# Patient Record
Sex: Male | Born: 1970
Health system: Southern US, Community
[De-identification: ages and names within clinical notes are randomized; demographics above are authoritative.]

## PROBLEM LIST (undated history)

## (undated) DIAGNOSIS — M1990 Unspecified osteoarthritis, unspecified site: Secondary | ICD-10-CM

## (undated) DIAGNOSIS — G4733 Obstructive sleep apnea (adult) (pediatric): Secondary | ICD-10-CM

## (undated) DIAGNOSIS — G4730 Sleep apnea, unspecified: Secondary | ICD-10-CM

## (undated) DIAGNOSIS — IMO0002 Reserved for concepts with insufficient information to code with codable children: Secondary | ICD-10-CM

## (undated) DIAGNOSIS — M5030 Other cervical disc degeneration, unspecified cervical region: Principal | ICD-10-CM

## (undated) DIAGNOSIS — I8290 Acute embolism and thrombosis of unspecified vein: Secondary | ICD-10-CM

## (undated) HISTORY — DX: Obstructive sleep apnea (adult) (pediatric): G47.33

## (undated) HISTORY — PX: HERNIA REPAIR: SHX51

## (undated) HISTORY — DX: Unspecified osteoarthritis, unspecified site: M19.90

## (undated) HISTORY — DX: Acute embolism and thrombosis of unspecified vein: I82.90

## (undated) HISTORY — PX: HEMORRHOID SURGERY: SHX153

## (undated) HISTORY — PX: COLONOSCOPY: SHX174

## (undated) HISTORY — DX: Other cervical disc degeneration, unspecified cervical region: M50.30

## (undated) HISTORY — DX: Sleep apnea, unspecified: G47.30

## (undated) HISTORY — DX: Reserved for concepts with insufficient information to code with codable children: IMO0002

---

## 2004-09-03 ENCOUNTER — Emergency Department: Payer: Self-pay | Admitting: Emergency Medicine

## 2009-11-14 ENCOUNTER — Ambulatory Visit: Payer: Self-pay | Admitting: Emergency Medicine

## 2009-11-15 ENCOUNTER — Emergency Department: Payer: Self-pay | Admitting: Internal Medicine

## 2009-11-18 ENCOUNTER — Ambulatory Visit: Payer: Self-pay | Admitting: Emergency Medicine

## 2010-06-21 ENCOUNTER — Emergency Department: Payer: Self-pay | Admitting: Unknown Physician Specialty

## 2013-11-30 ENCOUNTER — Emergency Department: Payer: Self-pay | Admitting: Emergency Medicine

## 2014-04-14 DIAGNOSIS — Z8719 Personal history of other diseases of the digestive system: Secondary | ICD-10-CM | POA: Insufficient documentation

## 2014-05-03 ENCOUNTER — Ambulatory Visit: Payer: Self-pay | Admitting: Gastroenterology

## 2014-05-03 LAB — HM COLONOSCOPY

## 2014-06-07 LAB — SURGICAL PATHOLOGY

## 2015-11-02 ENCOUNTER — Encounter: Payer: Self-pay | Admitting: *Deleted

## 2015-11-02 ENCOUNTER — Emergency Department
Admission: EM | Admit: 2015-11-02 | Discharge: 2015-11-02 | Disposition: A | Discharge status: Home / Self Care | Payer: Federal, State, Local not specified - PPO | Attending: Emergency Medicine | Admitting: Emergency Medicine

## 2015-11-02 DIAGNOSIS — Y929 Unspecified place or not applicable: Secondary | ICD-10-CM | POA: Diagnosis not present

## 2015-11-02 DIAGNOSIS — Y939 Activity, unspecified: Secondary | ICD-10-CM | POA: Insufficient documentation

## 2015-11-02 DIAGNOSIS — S61210A Laceration without foreign body of right index finger without damage to nail, initial encounter: Secondary | ICD-10-CM | POA: Diagnosis present

## 2015-11-02 DIAGNOSIS — W268XXA Contact with other sharp object(s), not elsewhere classified, initial encounter: Secondary | ICD-10-CM | POA: Diagnosis not present

## 2015-11-02 DIAGNOSIS — Y999 Unspecified external cause status: Secondary | ICD-10-CM | POA: Insufficient documentation

## 2015-11-02 DIAGNOSIS — S61219A Laceration without foreign body of unspecified finger without damage to nail, initial encounter: Secondary | ICD-10-CM

## 2015-11-02 MED ORDER — TRAMADOL HCL 50 MG PO TABS: 50.0000 mg | ORAL_TABLET | Freq: Four times a day (QID) | ORAL | 0 refills | Status: DC | PRN

## 2015-11-02 MED ORDER — LIDOCAINE HCL (PF) 1 % IJ SOLN
INTRAMUSCULAR | Status: AC
  Filled 2015-11-02: qty 5

## 2015-11-02 NOTE — ED Provider Notes (Signed)
[facility] Emergency Department Provider Note   ____________________________________________   None    (approximate)  I have reviewed the triage vital signs and the nursing notes.   HISTORY  Chief Complaint Extremity Laceration    HPI Nicholas Patterson is a 45 y.o. male patient with a cut to the right index finger. Patient state finger was cut on a license plate approximately 4 arrival. Patient state he was controlled with direct pressure. Patient states tetanus shot is up-to-date. Patient denies any loss sensation or loss of function of the finger. Patient rates his pain as a 5/10. Patient described a pain as "dull".   History reviewed. No pertinent past medical history.  There are no active problems to display for this patient.   Past Surgical History:  Procedure Laterality Date  . COLONOSCOPY    . HEMORRHOID SURGERY    . HERNIA REPAIR      Prior to Admission medications   Medication Sig Start Date End Date Taking? Authorizing Provider  traMADol (ULTRAM) 50 MG tablet Take 1 tablet (50 mg total) by mouth every 6 (six) hours as needed for moderate pain. 11/02/15   Joni Reining, PA-C    Allergies Oxycodone  History reviewed. No pertinent family history.  Social History Social History  Substance Use Topics  . Smoking status: Never Smoker  . Smokeless tobacco: Never Used  . Alcohol use Yes     Comment: occasionally    Review of Systems Constitutional: No fever/chills Eyes: No visual changes. ENT: No sore throat. Cardiovascular: Denies chest pain. Respiratory: Denies shortness of breath. Gastrointestinal: No abdominal pain.  No nausea, no vomiting.  No diarrhea.  No constipation. Genitourinary: Negative for dysuria. Musculoskeletal: Negative for back pain. Skin: Negative for rash finger. Laceration  Neurological: Negative for headaches, focal weakness or  numbness.    ____________________________________________   PHYSICAL EXAM:  VITAL SIGNS: ED Triage Vitals  Enc Vitals Group     BP 11/02/15 2138 (!) 162/92     Pulse Rate 11/02/15 2138 96     Resp 11/02/15 2138 20     Temp 11/02/15 2138 98.3 F (36.8 C)     Temp Source 11/02/15 2138 Oral     SpO2 11/02/15 2138 98 %     Weight 11/02/15 2139 245 lb (111.1 kg)     Height 11/02/15 2139 6\' 1"  (1.854 m)     Head Circumference --      Peak Flow --      Pain Score 11/02/15 2139 5     Pain Loc --      Pain Edu? --      Excl. in GC? --     Constitutional: Alert and oriented. Well appearing and in no acute distress. Eyes: Conjunctivae are normal. PERRL. EOMI. Head: Atraumatic. Nose: No congestion/rhinnorhea. Mouth/Throat: Mucous membranes are moist.  Oropharynx non-erythematous. Neck: No stridor.  No cervical spine tenderness to palpation. Hematological/Lymphatic/Immunilogical: No cervical lymphadenopathy. Cardiovascular: Normal rate, regular rhythm. Grossly normal heart sounds.  Good peripheral circulation. Respiratory: Normal respiratory effort.  No retractions. Lungs CTAB. Gastrointestinal: Soft and nontender. No distention. No abdominal bruits. No CVA tenderness. Musculoskeletal: No lower extremity tenderness nor edema.  No joint effusions. Neurologic:  Normal speech and language. No gross focal neurologic deficits are appreciated. No gait instability. Skin:  Skin is warm, dry and intact. No rash noted.1.5 cm laceration to the volar aspect of the first digit right hand. Psychiatric: Mood and affect are normal. Speech and behavior  are normal.  ____________________________________________   LABS (all labs ordered are listed, but only abnormal results are displayed)  Labs Reviewed - No data to  display ____________________________________________  EKG   ____________________________________________  RADIOLOGY   ____________________________________________   PROCEDURES  Procedure(s) performed: LACERATION REPAIR Performed by: Joni Reiningonald K Namita Yearwood Authorized by: Joni Reiningonald K Dillon Livermore Consent: Verbal consent obtained. Risks and benefits: risks, benefits and alternatives were discussed Consent given by: patient Patient identity confirmed: provided demographic data Prepped and Draped in normal sterile fashion Wound explored  Laceration Location: Second digit right hand  Laceration Length: 1.5cm  No Foreign Bodies seen or palpated  Anesthesia: Digital block Local anesthetic: lidocaine percent without epinephrine  Anesthetic total: 4 ml  Irrigation method: syringe Amount of cleaning: standard  Skin closure: 4-0 nylon Number of sutures: 6 Technique: Interrupted   Patient tolerance: Patient tolerated the procedure well with no immediate complications.  Procedures  Critical Care performed: No  ____________________________________________   INITIAL IMPRESSION / ASSESSMENT AND PLAN / ED COURSE  Pertinent labs & imaging results that were available during my care of the patient were reviewed by me and considered in my medical decision making (see chart for details).  Finger laceration. Patient given discharge Instructions. Patient advised return back in 10 days for suture removal.  Clinical Course  Patient remained neurovascularly intact status post suturing. Patient fingers will be buddy taped.   ____________________________________________   FINAL CLINICAL IMPRESSION(S) / ED DIAGNOSES  Final diagnoses:  Laceration of finger, initial encounter      NEW MEDICATIONS STARTED DURING THIS VISIT:  New Prescriptions   TRAMADOL (ULTRAM) 50 MG TABLET    Take 1 tablet (50 mg total) by mouth every 6 (six) hours as needed for moderate pain.     Note:  This document  was prepared using Dragon voice recognition software and may include unintentional dictation errors.    Joni ReiningRonald K Maceo Hernan, PA-C 11/02/15 2231    Nita Sicklearolina Veronese, MD 11/02/15 [phone]

## 2015-11-02 NOTE — ED Triage Notes (Signed)
Pt cut R index finger on a license plate x 1 hr ago. Bleeding controlled in triage, dressing placed in triage.

## 2015-12-22 ENCOUNTER — Ambulatory Visit (INDEPENDENT_AMBULATORY_CARE_PROVIDER_SITE_OTHER): Payer: Federal, State, Local not specified - PPO | Admitting: Urology

## 2015-12-22 ENCOUNTER — Encounter: Payer: Self-pay | Admitting: Urology

## 2015-12-22 VITALS — BP 138/82 | HR 97 | Ht 73.0 in | Wt 262.4 lb

## 2015-12-22 DIAGNOSIS — Z3009 Encounter for other general counseling and advice on contraception: Secondary | ICD-10-CM | POA: Diagnosis not present

## 2015-12-22 MED ORDER — DIAZEPAM 10 MG PO TABS: ORAL_TABLET | ORAL | 0 refills | Status: DC

## 2015-12-22 NOTE — Progress Notes (Signed)
12/22/2015 12:06 PM   Nicholas Maroonarl Richard Entwistle 08/01/1970 [id number]  Referring provider: [facility] [address] [place], [place] [address]  Chief Complaint  Patient presents with  . VAS Consult    new patient referred by [facility] Dr. Richarda BladeAdamo    HPI: Mr. Nicholas Patterson is a 45 year old African American male presents today requesting a vasectomy.  Patient has 3 children, one son and two daughters, and wishes to end his family unit at this point.  Patient denies any history of chronic prostatitis, epididymitis, orchitis, or other genital pain.  Today, we discussed what the vas deferens is, where it is located, and its function. We reviewed the procedure for vasectomy, it's risks, benefits, alternatives, and likelihood of achieving his goals.   We discussed in detail the procedure, complications, and recovery as well as the need for clearance prior to unprotected intercourse. We discussed that vasectomy does not protect against sexually transmitted diseases. We discussed that this procedure does not result in immediate sterility and that they would need to use other forms of birth control until he has been cleared with a three month negative postvasectomy semen analyses.  I explained that the procedure is considered to be permanent and that attempts at reversal have varying degrees of success. These options include vasectomy reversal, sperm retrieval, and in vitro fertilization; these can be very expensive.   We discussed the chance of postvasectomy pain syndrome which occurs in less than 5% of patients. I explained to the patient that there is no treatment to resolve this chronic pain, and that if it developed I would not be able to help resolve the issue, but that surgery is generally not needed for correction.   I explained there have even been reports of systemic like illness associated with this chronic pain, and that there was no good cure. I explained that  vasectomy it is not a 100% reliable form of birth control, and the risk of pregnancy after vasectomy is approximately 1 in 2000 men who had a negative postvasectomy semen analysis or rare non-motile sperm.  I explained that repeat vasectomy was necessary in less than 1% of vasectomy procedures when employing the type of technique that is performed in the office. I explained that he should refrain from ejaculation for approximately one week following vasectomy. I explained that there are other options for birth control which are permanent and non-permanent; we discussed these.  I explained the rates of surgical complications, such as symptomatic hematoma or infection, are low (1-2%) and vary with the surgeon's experience and criteria used to diagnose the complication.   PMH: No past medical history on file.  Surgical History: Past Surgical History:  Procedure Laterality Date  . COLONOSCOPY    . HEMORRHOID SURGERY    . HERNIA REPAIR      Home Medications:    Medication List       Accurate as of 12/22/15 12:06 PM. Always use your most recent med list.          diazepam 10 MG tablet Commonly known as:  VALIUM Take one tablet 30 minutes prior to the vasectomy   traMADol 50 MG tablet Commonly known as:  ULTRAM Take 1 tablet (50 mg total) by mouth every 6 (six) hours as needed for moderate pain.       Allergies:  Allergies  Allergen Reactions  . Oxycodone Nausea And Vomiting    Family History: Family History  Problem Relation Age  of Onset  . Prostate cancer Neg Hx   . Kidney disease Neg Hx     Social History:  reports that he has never smoked. He has never used smokeless tobacco. He reports that he drinks alcohol. He reports that he does not use drugs.  ROS: UROLOGY Frequent Urination?: No Hard to postpone urination?: No Burning/pain with urination?: No Get up at night to urinate?: No Leakage of urine?: No Urine stream starts and stops?: No Trouble starting  stream?: No Do you have to strain to urinate?: No Blood in urine?: No Urinary tract infection?: No Sexually transmitted disease?: No Injury to kidneys or bladder?: No Painful intercourse?: No Weak stream?: No Erection problems?: No Penile pain?: No  Gastrointestinal Nausea?: No Vomiting?: No Indigestion/heartburn?: No Diarrhea?: No Constipation?: No  Constitutional Fever: No Night sweats?: No Weight loss?: No Fatigue?: No  Skin Skin rash/lesions?: No Itching?: No  Eyes Blurred vision?: No Double vision?: No  Ears/Nose/Throat Sore throat?: No Sinus problems?: No  Hematologic/Lymphatic Swollen glands?: No Easy bruising?: No  Cardiovascular Leg swelling?: No Chest pain?: No  Respiratory Cough?: No Shortness of breath?: No  Endocrine Excessive thirst?: No  Musculoskeletal Back pain?: No Joint pain?: No  Neurological Headaches?: No Dizziness?: No  Psychologic Depression?: No Anxiety?: No  Physical Exam: BP 138/82   Pulse 97   Ht 6\' 1"  (1.854 m)   Wt 262 lb 6.4 oz (119 kg)   BMI 34.62 kg/m   Constitutional: Well nourished. Alert and oriented, No acute distress. HEENT: [place] AT, moist mucus membranes. Trachea midline, no masses. Cardiovascular: No clubbing, cyanosis, or edema. Respiratory: Normal respiratory effort, no increased work of breathing. GI: Abdomen is soft, non tender, non distended, no abdominal masses. Liver and spleen not palpable.  No hernias appreciated.  Stool sample for occult testing is not indicated.   GU: No CVA tenderness.  No bladder fullness or masses.  Patient with circumcised phallus.  Urethral meatus is patent.  No penile discharge. No penile lesions or rashes. Scrotum without lesions, cysts, rashes and/or edema.  Testicles are located scrotally bilaterally. No masses are appreciated in the testicles. Left and right epididymis are normal. Rectal: Deferred. Skin: No rashes, bruises or suspicious lesions. Lymph: No  cervical or inguinal adenopathy. Neurologic: Grossly intact, no focal deficits, moving all 4 extremities. Psychiatric: Normal mood and affect.   Assessment & Plan:    1. Vasectomy consult:  Patient has read and signed the consent.  He is given the pre-op vasectomy instruction sheet.  He is prescribed Valium 10 mg and instructed to take it 30 minutes prior to his vasectomy appointment.  He is to have a driver.  I reemphasized to the patient that this is to be considered a permanent form of birth control, that he is to use an alternative form of birth control until we receive the 3 months specimen and it is cleared of sperm and that this will not prevent STI's.  His questions are answered to his satisfaction and he understands the risks and is willing to proceed with the vasectomy.  He will schedule his vasectomy.    I spent 30 minutes in a face-to-face conversation concerning the vasectomy procedure and pre-and post op expectations.  Greater than 50% was spent in counseling & coordination of care with the patient.   Return for schedule vasectomy.  These notes generated with voice recognition software. I apologize for typographical errors.  Michiel CowboySHANNON Robinn Overholt, PA-C  [facility] [address], [address] [place],  [place] [address] [phone]

## 2016-09-08 DIAGNOSIS — S50819A Abrasion of unspecified forearm, initial encounter: Secondary | ICD-10-CM | POA: Diagnosis not present

## 2016-09-08 DIAGNOSIS — M25562 Pain in left knee: Secondary | ICD-10-CM | POA: Diagnosis not present

## 2016-09-09 ENCOUNTER — Emergency Department: Payer: Federal, State, Local not specified - PPO

## 2016-09-09 ENCOUNTER — Encounter: Payer: Self-pay | Admitting: Emergency Medicine

## 2016-09-09 ENCOUNTER — Emergency Department
Admission: EM | Admit: 2016-09-09 | Discharge: 2016-09-09 | Disposition: A | Discharge status: Home / Self Care | Payer: Federal, State, Local not specified - PPO | Attending: Emergency Medicine | Admitting: Emergency Medicine

## 2016-09-09 DIAGNOSIS — R102 Pelvic and perineal pain: Secondary | ICD-10-CM | POA: Diagnosis not present

## 2016-09-09 DIAGNOSIS — Y92410 Unspecified street and highway as the place of occurrence of the external cause: Secondary | ICD-10-CM | POA: Insufficient documentation

## 2016-09-09 DIAGNOSIS — S40211A Abrasion of right shoulder, initial encounter: Secondary | ICD-10-CM | POA: Diagnosis not present

## 2016-09-09 DIAGNOSIS — T07XXXA Unspecified multiple injuries, initial encounter: Secondary | ICD-10-CM

## 2016-09-09 DIAGNOSIS — T148XXA Other injury of unspecified body region, initial encounter: Secondary | ICD-10-CM | POA: Diagnosis not present

## 2016-09-09 DIAGNOSIS — M25551 Pain in right hip: Secondary | ICD-10-CM | POA: Insufficient documentation

## 2016-09-09 DIAGNOSIS — Z79899 Other long term (current) drug therapy: Secondary | ICD-10-CM | POA: Insufficient documentation

## 2016-09-09 DIAGNOSIS — M25552 Pain in left hip: Secondary | ICD-10-CM | POA: Insufficient documentation

## 2016-09-09 DIAGNOSIS — S199XXA Unspecified injury of neck, initial encounter: Secondary | ICD-10-CM | POA: Diagnosis not present

## 2016-09-09 DIAGNOSIS — Y999 Unspecified external cause status: Secondary | ICD-10-CM | POA: Diagnosis not present

## 2016-09-09 DIAGNOSIS — S3991XA Unspecified injury of abdomen, initial encounter: Secondary | ICD-10-CM | POA: Diagnosis not present

## 2016-09-09 DIAGNOSIS — Y9389 Activity, other specified: Secondary | ICD-10-CM | POA: Diagnosis not present

## 2016-09-09 DIAGNOSIS — S299XXA Unspecified injury of thorax, initial encounter: Secondary | ICD-10-CM | POA: Diagnosis not present

## 2016-09-09 DIAGNOSIS — S3993XA Unspecified injury of pelvis, initial encounter: Secondary | ICD-10-CM | POA: Diagnosis not present

## 2016-09-09 DIAGNOSIS — S0990XA Unspecified injury of head, initial encounter: Secondary | ICD-10-CM | POA: Diagnosis not present

## 2016-09-09 DIAGNOSIS — S40212A Abrasion of left shoulder, initial encounter: Secondary | ICD-10-CM | POA: Diagnosis not present

## 2016-09-09 LAB — BASIC METABOLIC PANEL
ANION GAP: 8 (ref 5–15)
BUN: 24 mg/dL — ABNORMAL HIGH (ref 6–20)
CHLORIDE: 106 mmol/L (ref 101–111)
CO2: 26 mmol/L (ref 22–32)
Calcium: 8.7 mg/dL — ABNORMAL LOW (ref 8.9–10.3)
Creatinine, Ser: 1.31 mg/dL — ABNORMAL HIGH (ref 0.61–1.24)
GFR calc Af Amer: >60 mL/min (ref >60)
GLUCOSE: 115 mg/dL — AB (ref 65–99)
POTASSIUM: 3.4 mmol/L — AB (ref 3.5–5.1)
Sodium: 140 mmol/L (ref 135–145)

## 2016-09-09 LAB — CBC
HEMATOCRIT: 44.5 % (ref 40.0–52.0)
HEMOGLOBIN: 14.9 g/dL (ref 13.0–18.0)
MCH: 27.7 pg (ref 26.0–34.0)
MCHC: 33.5 g/dL (ref 32.0–36.0)
MCV: 82.5 fL (ref 80.0–100.0)
PLATELETS: 218 10*3/uL (ref 150–440)
RBC: 5.4 MIL/uL (ref 4.40–5.90)
RDW: 14.6 % — ABNORMAL HIGH (ref 11.5–14.5)
WBC: 10.8 10*3/uL — AB (ref 3.8–10.6)

## 2016-09-09 MED ORDER — BACITRACIN ZINC 500 UNIT/GM EX OINT
TOPICAL_OINTMENT | Freq: Once | CUTANEOUS | Status: AC
  Administered 2016-09-09: 02:00:00 via TOPICAL
  Filled 2016-09-09: qty 5.4

## 2016-09-09 MED ORDER — HYDROCODONE-ACETAMINOPHEN 7.5-325 MG/15ML PO SOLN: 15.0000 mL | Freq: Four times a day (QID) | ORAL | 0 refills | Status: AC | PRN

## 2016-09-09 MED ORDER — KETOROLAC TROMETHAMINE 30 MG/ML IJ SOLN
30.0000 mg | Freq: Once | INTRAMUSCULAR | Status: AC
  Administered 2016-09-09: 30 mg via INTRAVENOUS
  Filled 2016-09-09: qty 1

## 2016-09-09 MED ORDER — HYDROCODONE-ACETAMINOPHEN 5-325 MG PO TABS
2.0000 | ORAL_TABLET | Freq: Once | ORAL | Status: AC
  Administered 2016-09-09: 2 via ORAL
  Filled 2016-09-09: qty 2

## 2016-09-09 MED ORDER — IOPAMIDOL (ISOVUE-300) INJECTION 61%
100.0000 mL | Freq: Once | INTRAVENOUS | Status: AC | PRN
  Administered 2016-09-09: 100 mL via INTRAVENOUS

## 2016-09-09 MED ORDER — IBUPROFEN 800 MG PO TABS: 800.0000 mg | ORAL_TABLET | Freq: Three times a day (TID) | ORAL | 0 refills | Status: DC | PRN
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
## 2016-09-12 DIAGNOSIS — K080 Exfoliation of teeth due to systemic causes: Secondary | ICD-10-CM | POA: Diagnosis not present

## 2016-09-12 DIAGNOSIS — K056 Periodontal disease, unspecified: Secondary | ICD-10-CM | POA: Diagnosis not present

## 2016-09-12 DIAGNOSIS — S40819D Abrasion of unspecified upper arm, subsequent encounter: Secondary | ICD-10-CM | POA: Diagnosis not present

## 2016-09-12 DIAGNOSIS — S40219D Abrasion of unspecified shoulder, subsequent encounter: Secondary | ICD-10-CM | POA: Diagnosis not present

## 2016-09-12 DIAGNOSIS — S80819D Abrasion, unspecified lower leg, subsequent encounter: Secondary | ICD-10-CM | POA: Diagnosis not present

## 2016-09-13 DIAGNOSIS — I493 Ventricular premature depolarization: Secondary | ICD-10-CM | POA: Diagnosis not present

## 2016-09-13 DIAGNOSIS — R071 Chest pain on breathing: Secondary | ICD-10-CM | POA: Diagnosis not present

## 2016-09-13 DIAGNOSIS — Z885 Allergy status to narcotic agent status: Secondary | ICD-10-CM | POA: Diagnosis not present

## 2016-09-13 DIAGNOSIS — R079 Chest pain, unspecified: Secondary | ICD-10-CM | POA: Diagnosis not present

## 2016-09-13 DIAGNOSIS — R0781 Pleurodynia: Secondary | ICD-10-CM | POA: Diagnosis not present

## 2016-09-14 DIAGNOSIS — K080 Exfoliation of teeth due to systemic causes: Secondary | ICD-10-CM | POA: Diagnosis not present

## 2016-09-19 DIAGNOSIS — S80819D Abrasion, unspecified lower leg, subsequent encounter: Secondary | ICD-10-CM | POA: Diagnosis not present

## 2016-09-19 DIAGNOSIS — S40219D Abrasion of unspecified shoulder, subsequent encounter: Secondary | ICD-10-CM | POA: Diagnosis not present

## 2016-09-19 DIAGNOSIS — S40819D Abrasion of unspecified upper arm, subsequent encounter: Secondary | ICD-10-CM | POA: Diagnosis not present

## 2016-10-09 DIAGNOSIS — R220 Localized swelling, mass and lump, head: Secondary | ICD-10-CM | POA: Diagnosis not present

## 2016-10-09 DIAGNOSIS — K0510 Chronic gingivitis, plaque induced: Secondary | ICD-10-CM | POA: Diagnosis not present

## 2016-10-09 DIAGNOSIS — M278 Other specified diseases of jaws: Secondary | ICD-10-CM | POA: Diagnosis not present

## 2016-10-09 DIAGNOSIS — J342 Deviated nasal septum: Secondary | ICD-10-CM | POA: Diagnosis not present

## 2016-10-17 DIAGNOSIS — K080 Exfoliation of teeth due to systemic causes: Secondary | ICD-10-CM | POA: Diagnosis not present

## 2016-11-06 DIAGNOSIS — K080 Exfoliation of teeth due to systemic causes: Secondary | ICD-10-CM | POA: Diagnosis not present

## 2016-11-14 DIAGNOSIS — K080 Exfoliation of teeth due to systemic causes: Secondary | ICD-10-CM | POA: Diagnosis not present

## 2017-03-07 DIAGNOSIS — R062 Wheezing: Secondary | ICD-10-CM | POA: Diagnosis not present

## 2017-03-07 DIAGNOSIS — Z1389 Encounter for screening for other disorder: Secondary | ICD-10-CM | POA: Diagnosis not present

## 2017-03-07 DIAGNOSIS — R030 Elevated blood-pressure reading, without diagnosis of hypertension: Secondary | ICD-10-CM | POA: Diagnosis not present

## 2017-03-07 DIAGNOSIS — J069 Acute upper respiratory infection, unspecified: Secondary | ICD-10-CM | POA: Diagnosis not present

## 2017-07-15 ENCOUNTER — Ambulatory Visit: Payer: Federal, State, Local not specified - PPO | Admitting: Family Medicine

## 2017-07-23 ENCOUNTER — Ambulatory Visit: Payer: Federal, State, Local not specified - PPO | Admitting: Family Medicine

## 2017-10-17 ENCOUNTER — Ambulatory Visit: Payer: Federal, State, Local not specified - PPO | Admitting: Family Medicine

## 2018-01-20 ENCOUNTER — Ambulatory Visit (INDEPENDENT_AMBULATORY_CARE_PROVIDER_SITE_OTHER): Payer: Federal, State, Local not specified - PPO | Admitting: Family Medicine

## 2018-01-20 ENCOUNTER — Encounter: Payer: Self-pay | Admitting: Family Medicine

## 2018-01-20 VITALS — BP 136/72 | HR 117 | Temp 98.2°F | Resp 16 | Ht 71.75 in | Wt 272.4 lb

## 2018-01-20 DIAGNOSIS — E669 Obesity, unspecified: Secondary | ICD-10-CM

## 2018-01-20 DIAGNOSIS — Z23 Encounter for immunization: Secondary | ICD-10-CM

## 2018-01-20 DIAGNOSIS — R519 Headache, unspecified: Secondary | ICD-10-CM

## 2018-01-20 DIAGNOSIS — Z131 Encounter for screening for diabetes mellitus: Secondary | ICD-10-CM | POA: Diagnosis not present

## 2018-01-20 DIAGNOSIS — R51 Headache: Secondary | ICD-10-CM

## 2018-01-20 DIAGNOSIS — M5030 Other cervical disc degeneration, unspecified cervical region: Secondary | ICD-10-CM

## 2018-01-20 DIAGNOSIS — R030 Elevated blood-pressure reading, without diagnosis of hypertension: Secondary | ICD-10-CM

## 2018-01-20 DIAGNOSIS — Z13220 Encounter for screening for lipoid disorders: Secondary | ICD-10-CM | POA: Diagnosis not present

## 2018-01-20 DIAGNOSIS — Z114 Encounter for screening for human immunodeficiency virus [HIV]: Secondary | ICD-10-CM

## 2018-01-20 DIAGNOSIS — Z130 Encounter for screening for diseases of the blood and blood-forming organs and certain disorders involving the immune mechanism: Secondary | ICD-10-CM

## 2018-01-20 HISTORY — DX: Other cervical disc degeneration, unspecified cervical region: M50.30

## 2018-01-20 NOTE — Patient Instructions (Signed)
Caffeine Withdrawal Caffeine withdrawal is a group of symptoms that can develop when a person who consumes a lot of caffeine every day suddenly stops or greatly reduces his or her caffeine intake. Caffeine is a drug that is usually found in coffee, tea, soda, cocoa, chocolate milk, chocolate, and some over-the-counter pain relievers and medicines. If you consume too much caffeine for a long period of time, you may go through caffeine withdrawal when you stop or reduce your intake. What are the causes? This condition is caused by having less caffeine than your body is used to having. What increases the risk? The following factors may make you more likely to develop this condition:  Having a history of other substance use disorders.  Having a history of a mood disorder, anxiety disorder, psychiatric disorder, or eating disorder.  Being in a situation that restricts caffeine use, such as pregnancy, fasting, medical procedures, or hospitalization.  Using energy drinks.  What are the signs or symptoms? Symptoms of this condition include:  Feeling more tired than usual.  Headaches.  Having trouble concentrating or staying alert.  Feeling irritable.  Feeling like you have the flu.  Craving caffeine.  Depression.  Nausea or vomiting.  Joint stiffness or aching muscles.  Your symptoms may be more or less severe, depending on how much caffeine you consume or have been consuming over time. How is this diagnosed? This condition is usually diagnosed based on your symptoms and your recent history of caffeine use. Your health care provider may ask you about any history of stimulant abuse or use of other substances. In some cases, you may be asked to use a food journal to keep track of how much caffeine you have every day. How is this treated? Treatment for this condition will focus on addressing the symptoms. Your health care provider may recommend that you:  Consume more caffeine at first  to help end the withdrawal symptoms.  Slowly reduce your caffeine use over time to avoid symptoms of caffeine withdrawal while removing it from your diet. Your health care provider can help you decide if you want to limit (cut back on) or eliminate caffeine from your diet.  Other treatments may be recommended to help with any underlying reasons for your high caffeine use. Your health care provider may also recommend techniques to manage stress. Follow these instructions at home:  Do not stop having caffeine all at once. Doing that may cause severe withdrawal symptoms.  To avoid withdrawal symptoms, do not have more than 50 mg of caffeine-equal to  cup of coffee-in one day.  Cut back on caffeine slowly over time as directed by your health care provider. For example, try mixing a caffeinated soda with a decaf (decaffeinated) soda.  Try replacing coffee, tea, or soda with a decaf drink.  Find ways to manage stress, such as by: ? Meditating. ? Being more active. ? Using deep breathing exercises. Contact a health care provider if:  Your headaches or other withdrawal symptoms do not go away after several days of reduced usage, or they do not go away after you start using caffeine again. Get help right away if:  You feel depressed or have suicidal thoughts.  You are vomiting or have severe dehydration. If you ever feel like you may hurt yourself or others, or have thoughts about taking your own life, get help right away. You can go to your nearest emergency department or call:  Your local emergency services (911 in the [place]  A suicide crisis helpline, such as the National Suicide Prevention Lifeline at 4843377284. This is open 24 hours a day.  Summary  Caffeine withdrawal is a group of symptoms that can develop when a person who consumes a lot of caffeine every day suddenly stops or greatly reduces his or her caffeine intake.  Your withdrawal symptoms may be more or less severe,  depending on how much caffeine you consume or have been consuming over time.  Your health care provider can help you decide if you want to limit (cut back on) or eliminate caffeine from your diet.  To avoid withdrawal symptoms, do not have more than 50 mg of caffeine-equal to  cup of coffee-in one day. This information is not intended to replace advice given to you by your health care provider. Make sure you discuss any questions you have with your health care provider. Document Released: 05/24/2016 Document Revised: 05/24/2016 Document Reviewed: 05/24/2016 Elsevier Interactive Patient Education  2018 [employer]. Analgesic Rebound Headache An analgesic rebound headache, sometimes called a medication overuse headache, is a headache that comes after pain medicine (analgesic) taken to treat the original (primary) headache has worn off. Any type of primary headache can return as a rebound headache if a person regularly takes analgesics more than three times a week to treat it. The types of primary headaches that are commonly associated with rebound headaches include:  Migraines.  Headaches that arise from tense muscles in the head and neck area (tension headaches).  Headaches that develop and happen again (recur) on one side of the head and around the eye (cluster headaches).  If rebound headaches continue, they become chronic daily headaches. What are the causes? This condition may be caused by frequent use of:  Over-the-counter medicines such as aspirin, ibuprofen, and acetaminophen.  Sinus relief medicines and other medicines that contain caffeine.  Narcotic pain medicines such as codeine and oxycodone.  What are the signs or symptoms? The symptoms of a rebound headache are the same as the symptoms of the original headache. Some of the symptoms of specific types of headaches include: Migraine headache  Pulsing or throbbing pain on one or both sides of the head.  Severe pain that  interferes with daily activities.  Pain that is worsened by physical activity.  Nausea, vomiting, or both.  Pain with exposure to bright light, loud noises, or strong smells.  General sensitivity to bright light, loud noises, or strong smells.  Visual changes.  Numbness of one or both arms. Tension headache  Pressure around the head.  Dull, aching head pain.  Pain felt over the front and sides of the head.  Tenderness in the muscles of the head, neck, and shoulders. Cluster headache  Severe pain that begins in or around one eye or temple.  Redness and tearing in the eye on the same side as the pain.  Droopy or swollen eyelid.  One-sided head pain.  Nausea.  Runny nose.  Sweaty, pale facial skin.  Restlessness. How is this diagnosed? This condition is diagnosed by:  Reviewing your medical history. This includes the nature of your primary headaches.  Reviewing the types of pain medicines that you have been using to treat your headaches and how often you take them.  How is this treated? This condition may be treated or managed by:  Discontinuing frequent use of the analgesic medicine. Doing this may worsen your headaches at first, but the pain should eventually become more manageable, less frequent, and less severe.  Seeing a headache specialist. He or she may be able to help you manage your headaches and help make sure there is not another cause of the headaches.  Using methods of stress relief, such as acupuncture, counseling, biofeedback, and massage. Talk with your health care provider about which methods might be good for you.  Follow these instructions at home:  Take over-the-counter and prescription medicines only as told by your health care provider.  Stop the repeated use of pain medicine as told by your health care provider. Stopping can be difficult. Carefully follow instructions from your health care provider.  Avoid triggers that are known to  cause your primary headaches.  Keep all follow-up visits as told by your health care provider. This is important. Contact a health care provider if:  You continue to experience headaches after following treatments that your health care provider recommended. Get help right away if:  You develop new headache pain.  You develop headache pain that is different than what you have experienced in the past.  You develop numbness or tingling in your arms or legs.  You develop changes in your speech or vision. This information is not intended to replace advice given to you by your health care provider. Make sure you discuss any questions you have with your health care provider. Document Released: 04/21/2003 Document Revised: 08/19/2015 Document Reviewed: 07/04/2015 Elsevier Interactive Patient Education  [employer].
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
## 2018-01-21 LAB — COMPLETE METABOLIC PANEL WITH GFR
AG RATIO: 1.6 (calc) (ref 1.0–2.5)
ALBUMIN MSPROF: 4.2 g/dL (ref 3.6–5.1)
ALT: 28 U/L (ref 9–46)
AST: 31 U/L (ref 10–40)
Alkaline phosphatase (APISO): 70 U/L (ref 40–115)
BUN: 17 mg/dL (ref 7–25)
CALCIUM: 9.4 mg/dL (ref 8.6–10.3)
CHLORIDE: 104 mmol/L (ref 98–110)
CO2: 27 mmol/L (ref 20–32)
CREATININE: 1.25 mg/dL (ref 0.60–1.35)
GFR, EST AFRICAN AMERICAN: 80 mL/min/{1.73_m2} (ref >=60)
GFR, EST NON AFRICAN AMERICAN: 69 mL/min/{1.73_m2} (ref >=60)
GLOBULIN: 2.6 g/dL (ref 1.9–3.7)
Glucose, Bld: 104 mg/dL (ref 65–139)
POTASSIUM: 3.7 mmol/L (ref 3.5–5.3)
SODIUM: 141 mmol/L (ref 135–146)
TOTAL PROTEIN: 6.8 g/dL (ref 6.1–8.1)
Total Bilirubin: 0.7 mg/dL (ref 0.2–1.2)

## 2018-01-21 LAB — HIV ANTIBODY (ROUTINE TESTING W REFLEX): HIV 1&2 Ab, 4th Generation: NONREACTIVE

## 2018-01-21 LAB — CBC WITH DIFFERENTIAL/PLATELET
BASOS ABS: 53 {cells}/uL (ref 0–200)
BASOS PCT: 0.6 %
Eosinophils Absolute: 141 cells/uL (ref 15–500)
Eosinophils Relative: 1.6 %
HEMATOCRIT: 46.1 % (ref 38.5–50.0)
HEMOGLOBIN: 15.5 g/dL (ref 13.2–17.1)
LYMPHS ABS: 2939 {cells}/uL (ref 850–3900)
MCH: 28 pg (ref 27.0–33.0)
MCHC: 33.6 g/dL (ref 32.0–36.0)
MCV: 83.4 fL (ref 80.0–100.0)
MONOS PCT: 5.6 %
MPV: 10.3 fL (ref 7.5–12.5)
Neutro Abs: 5174 cells/uL (ref 1500–7800)
Neutrophils Relative %: 58.8 %
Platelets: 283 10*3/uL (ref 140–400)
RBC: 5.53 10*6/uL (ref 4.20–5.80)
RDW: 13.2 % (ref 11.0–15.0)
Total Lymphocyte: 33.4 %
WBC: 8.8 10*3/uL (ref 3.8–10.8)
WBCMIX: 493 {cells}/uL (ref 200–950)

## 2018-01-21 LAB — LIPID PANEL
Cholesterol: 207 mg/dL — ABNORMAL HIGH (ref <200)
HDL: 42 mg/dL (ref >40)
LDL Cholesterol (Calc): 122 mg/dL (calc) — ABNORMAL HIGH
NON-HDL CHOLESTEROL (CALC): 165 mg/dL — AB (ref <130)
Total CHOL/HDL Ratio: 4.9 (calc) (ref <5.0)
Triglycerides: 299 mg/dL — ABNORMAL HIGH (ref <150)

## 2018-01-21 LAB — HEMOGLOBIN A1C
HEMOGLOBIN A1C: 5.8 %{Hb} — AB (ref <5.7)
Mean Plasma Glucose: 120 (calc)
eAG (mmol/L): 6.6 (calc)

## 2018-01-21 MED ORDER — AMITRIPTYLINE HCL 25 MG PO TABS: 25.0000 mg | ORAL_TABLET | Freq: Every day | ORAL | 0 refills | Status: DC

## 2018-01-24 ENCOUNTER — Encounter: Payer: Self-pay | Admitting: Family Medicine

## 2018-01-29 ENCOUNTER — Encounter: Payer: Self-pay | Admitting: Family Medicine

## 2018-02-17 ENCOUNTER — Other Ambulatory Visit: Payer: Self-pay | Admitting: Family Medicine

## 2018-02-17 DIAGNOSIS — R519 Headache, unspecified: Secondary | ICD-10-CM

## 2018-02-17 DIAGNOSIS — R51 Headache: Principal | ICD-10-CM

## 2018-02-17 NOTE — Telephone Encounter (Signed)
Refill request for general medication.  Amitriptyline to [facility].   Last office visit 01/20/2018  No follow-ups on file.

## 2018-02-24 ENCOUNTER — Ambulatory Visit: Payer: Federal, State, Local not specified - PPO | Admitting: Family Medicine

## 2018-02-26 ENCOUNTER — Encounter: Payer: Self-pay | Admitting: Family Medicine

## 2018-02-26 ENCOUNTER — Ambulatory Visit (INDEPENDENT_AMBULATORY_CARE_PROVIDER_SITE_OTHER): Payer: Federal, State, Local not specified - PPO | Admitting: Family Medicine

## 2018-02-26 VITALS — BP 112/76 | HR 87 | Temp 98.7°F | Resp 16 | Ht 72.0 in | Wt 275.0 lb

## 2018-02-26 DIAGNOSIS — R51 Headache: Secondary | ICD-10-CM

## 2018-02-26 DIAGNOSIS — R0683 Snoring: Secondary | ICD-10-CM | POA: Diagnosis not present

## 2018-02-26 DIAGNOSIS — R519 Headache, unspecified: Secondary | ICD-10-CM | POA: Insufficient documentation

## 2018-02-26 MED ORDER — TOPIRAMATE 25 MG PO TABS: 25.0000 mg | ORAL_TABLET | Freq: Every day | ORAL | 1 refills | Status: DC

## 2018-02-26 NOTE — Progress Notes (Signed)
Name: Nicholas Patterson   MRN: [id number]    DOB: 1970/08/29   Date:02/26/2018       Progress Note  Subjective  Chief Complaint  Chief Complaint  Patient presents with  . Follow-up    1 month recheck, could not tolerate medication    HPI  Elevated blood pressure/Headaches: He was seen 1 month ago by PCP Dr. Carlynn Purl because he was having headaches at home and bp was elevated (not sure of values). He states headaches are usually in the mornings upon waking and described as throbbing and frontal. Not associated with phonophobia, photophobia or nausea. He states headache usually improves after he drinks coffee and Advil (taking 2-3 times a week).  He no longer drinks tea with lunch and sodas in the afternoon.  Discussed daily medication options - did not tolerate Elavil as it made him too drowsy.  We will try topamax - discussed SE profile, may cause some drowsiness, take at night.  He does note that he is snoring loudly at nigh, is obese, is getting about 6 hours at night, and having daily headache. We will refer to [facility] for evaluation.  Patient Active Problem List   Diagnosis Date Noted  . DDD (degenerative disc disease), cervical 01/20/2018  . History of rectal bleeding 04/14/2014    Social History   Tobacco Use  . Smoking status: Never Smoker  . Smokeless tobacco: Never Used  Substance Use Topics  . Alcohol use: Yes    Comment: occasionally     Current Outpatient Medications:  .  amitriptyline (ELAVIL) 25 MG tablet, TAKE 1 TABLET (25 MG TOTAL) BY MOUTH AT BEDTIME. FOR HEADCHES (Patient not taking: Reported on 02/26/2018), Disp: 30 tablet, Rfl: 0  Allergies  Allergen Reactions  . Oxycodone Nausea And Vomiting    I personally reviewed active problem list, medication list, allergies, notes from last encounter, lab results with the patient/caregiver today.  ROS  Constitutional: Negative for fever or weight change.  Respiratory: Negative for cough and shortness of breath.     Cardiovascular: Negative for chest pain or palpitations.  Gastrointestinal: Negative for abdominal pain, no bowel changes.  Musculoskeletal: Negative for gait problem or joint swelling.  Skin: Negative for rash.  Neurological: Negative for dizziness; positive for mild headache.  No other specific complaints in a complete review of systems (except as listed in HPI above).  Objective  Vitals:   02/26/18 0739  BP: 112/76  Pulse: 87  Resp: 16  Temp: 98.7 F (37.1 C)  TempSrc: Oral  SpO2: 97%  Weight: 275 lb (124.7 kg)  Height: 6' (1.829 m)   Body mass index is 37.3 kg/m.  Nursing Note and Vital Signs reviewed.  Physical Exam  Constitutional: Patient appears well-developed and well-nourished. No distress.  HENT: Head: Normocephalic and atraumatic. Eyes: Conjunctivae and EOM are normal. No scleral icterus.  Pupils are equal, round, and reactive to light.  Neck: Normal range of motion. Neck supple. No JVD present. No thyromegaly present.  Cardiovascular: Normal rate, regular rhythm and normal heart sounds.  No murmur heard. No BLE edema. Pulmonary/Chest: Effort normal and breath sounds normal. No respiratory distress. Musculoskeletal: Normal range of motion, no joint effusions. No gross deformities Neurological: Pt is alert and oriented to person, place, and time. No cranial nerve deficit. Coordination, balance, strength, speech and gait are normal.  Skin: Skin is warm and dry. No rash noted. No erythema.  Psychiatric: Patient has a normal mood and affect. behavior is normal. Judgment and  thought content normal.  No results found for this or any previous visit (from the past 72 hour(s)).  Assessment & Plan  1. Chronic daily headache - topiramate (TOPAMAX) 25 MG tablet; Take 1 tablet (25 mg total) by mouth at bedtime.  Dispense: 30 tablet; Refill: 1 - Strongly recommend he stop all NSAIDS, taking Tylenol PRN, and trying to cut back more on caffeine.  Discussed common headache  triggers.  Will follow up in 1 month.   2. Loud snoring - Ambulatory referral to Pulmonology   -Red flags and when to present for emergency care or RTC including fever >101.63F, chest pain, shortness of breath, new/worsening/un-resolving symptoms, stroke symptoms reviewed with patient at time of visit. Follow up and care instructions discussed and provided in AVS.

## 2018-02-26 NOTE — Patient Instructions (Signed)
Headache Tips:  Keep a headache log: Record the date and time your headaches start and end, the severity of your pain (0-10), any possible triggers, what treatments you tried, and if any of those treatments worked.  Common Headache Triggers: Certain food additives, alcohol, artificial sweeteners (like aspartame), caffeine (overuse and when you stop suddenly), delayed or skipped meals, exercise, certain foods (chocolate, soft cheeses, red wine), bright lights, loud noises, menses, certain odors, oral contraceptives, depression and anxiety, sleep issues, smoke inhalation (smoking or second-hand smoke), stress, weather changes  Supplements known to reduce headaches and migraines are: Riboflavin - Vitamin B2 - goal is 200mg/day twice per day with food Magnesium - goal is 200mg twice per day with food OR MigreLief - 1 tablet twice per day with food. This is a combination of the Riboflavin and Magnesium. It is only available at some health food stores and from [employer].   Please work to reduce your stress by taking at least 30 minutes to exercise, stretch, and/or meditate every day.  Drink plenty of water throughout the day, do not skip meals, and eat healthy foods that are not fried, fatty, high in sugar, or processed.  

## 2018-03-05 ENCOUNTER — Ambulatory Visit: Payer: Federal, State, Local not specified - PPO | Admitting: Internal Medicine

## 2018-03-05 ENCOUNTER — Encounter: Payer: Self-pay | Admitting: Internal Medicine

## 2018-03-05 VITALS — BP 112/80 | HR 82 | Resp 16 | Ht 72.0 in | Wt 276.0 lb

## 2018-03-05 DIAGNOSIS — G4719 Other hypersomnia: Secondary | ICD-10-CM

## 2018-03-05 NOTE — Progress Notes (Signed)
[facility]      Assessment and Plan:  Excessive daytime sleepiness. -Symptoms and signs of obstructive sleep apnea. -We will send for sleep study.   Morning headaches. - Possibly related to sleep apnea, therefore treatment of sleep apnea is important part of management.  Orders Placed This Encounter  Procedures  . Home sleep test   Return in about 3 months (around 06/04/2018).   Date: 03/05/2018  MRN# [id number] Sherian MaroonCarl Richard Landowski 10/23/1970   Jabier Gaussarl Richard Jimmey Ralpharker is a 48 y.o. old male seen in consultation for chief complaint of:    Chief Complaint  Patient presents with  . Consult    referred by Maurice SmallEmily Boyce for eval of OSA.  Marland Kitchen. Snoring  . Headache    upon awakening in the am.  . Hypertension    HPI:   His wife asked him to come because he snores a lot, he is sleepy during the day. He goes to bed at 9pm, wakes at 4:30am. He wakes feeling tired, he has been having am headaches. He has been started on topamax which has helped somewhat.  He has never been tested for OSA.  Denies sleep paralysis, no sleep walking, no cataplexy, no jaw pain, no history of TMJ.     PMHX:   Past Medical History:  Diagnosis Date  . DDD (degenerative disc disease), cervical 01/20/2018   Surgical Hx:  Past Surgical History:  Procedure Laterality Date  . COLONOSCOPY    . HEMORRHOID SURGERY    . HERNIA REPAIR     Family Hx:  Family History  Problem Relation Age of Onset  . COPD Mother   . Cancer Father        Throat  . Prostate cancer Neg Hx   . Kidney disease Neg Hx    Social Hx:   Social History   Tobacco Use  . Smoking status: Never Smoker  . Smokeless tobacco: Never Used  Substance Use Topics  . Alcohol use: Yes    Comment: occasionally  . Drug use: No   Medication:    Current Outpatient Medications:  .  topiramate (TOPAMAX) 25 MG tablet, Take 1 tablet (25 mg total) by mouth at bedtime., Disp: 30 tablet, Rfl: 1   Allergies:    Oxycodone  Review of Systems: Gen:  Denies  fever, sweats, chills HEENT: Denies blurred vision, double vision. bleeds, sore throat Cvc:  No dizziness, chest pain. Resp:   Denies cough or sputum production, shortness of breath Gi: Denies swallowing difficulty, stomach pain. Gu:  Denies bladder incontinence, burning urine Ext:   No Joint pain, stiffness. Skin: No skin rash,  hives  Endoc:  No polyuria, polydipsia. Psych: No depression, insomnia. Other:  All other systems were reviewed with the patient and were negative other that what is mentioned in the HPI.   Physical Examination:   VS: BP 112/80 (BP Location: Left Arm, Cuff Size: Large)   Pulse 82   Ht 6' (1.829 m)   SpO2 95%   BMI 37.30 kg/m   General Appearance: No distress  Neuro:without focal findings,  speech normal,  HEENT: PERRLA, EOM intact.   Pulmonary: normal breath sounds, No wheezing.  CardiovascularNormal S1,S2.  No m/r/g.   Abdomen: Benign, Soft, non-tender. Renal:  No costovertebral tenderness  GU:  No performed at this time. Endoc: No evident thyromegaly, no signs of acromegaly. Skin:   warm, no rashes, no ecchymosis  Extremities: normal, no cyanosis, clubbing.  Other findings:  LABORATORY PANEL:   CBC No results for input(s): WBC, HGB, HCT, PLT in the last 168 hours. ------------------------------------------------------------------------------------------------------------------  Chemistries  No results for input(s): NA, K, CL, CO2, GLUCOSE, BUN, CREATININE, CALCIUM, MG, AST, ALT, ALKPHOS, BILITOT in the last 168 hours.  Invalid input(s): GFRCGP ------------------------------------------------------------------------------------------------------------------  Cardiac Enzymes No results for input(s): TROPONINI in the last 168 hours. ------------------------------------------------------------  RADIOLOGY:  No results found.     Thank  you for the consultation and for allowing [facility] to assist in the care of your patient. Our recommendations are noted above.  Please contact us if we can be of further service.   Wells Guileseep Kieth Hartis, M.D., F.C.C.P.  Board Certified in Internal Medicine, Pulmonary Medicine, Critical Care Medicine, and Sleep Medicine.  [facility] Office Number: [phone]   03/05/2018

## 2018-03-05 NOTE — Patient Instructions (Signed)

## 2018-03-07 DIAGNOSIS — G4733 Obstructive sleep apnea (adult) (pediatric): Secondary | ICD-10-CM | POA: Diagnosis not present

## 2018-03-12 ENCOUNTER — Telehealth: Payer: Self-pay | Admitting: *Deleted

## 2018-03-12 DIAGNOSIS — G4733 Obstructive sleep apnea (adult) (pediatric): Secondary | ICD-10-CM

## 2018-03-12 NOTE — Telephone Encounter (Signed)
Pt aware of results.  Orders placed. 

## 2018-03-18 ENCOUNTER — Other Ambulatory Visit: Payer: Self-pay

## 2018-03-18 DIAGNOSIS — G4719 Other hypersomnia: Secondary | ICD-10-CM

## 2018-03-24 DIAGNOSIS — G4733 Obstructive sleep apnea (adult) (pediatric): Secondary | ICD-10-CM | POA: Diagnosis not present

## 2018-04-22 DIAGNOSIS — G4733 Obstructive sleep apnea (adult) (pediatric): Secondary | ICD-10-CM | POA: Diagnosis not present

## 2018-04-29 ENCOUNTER — Telehealth: Payer: Self-pay | Admitting: Family Medicine

## 2018-04-29 DIAGNOSIS — R51 Headache: Principal | ICD-10-CM

## 2018-04-29 DIAGNOSIS — R519 Headache, unspecified: Secondary | ICD-10-CM

## 2018-04-30 NOTE — Telephone Encounter (Signed)
Pt needed a late morning appt, therefore I scheduled him with Irving Burton for 3.23.2020

## 2018-05-05 ENCOUNTER — Encounter: Payer: Self-pay | Admitting: Family Medicine

## 2018-05-05 ENCOUNTER — Other Ambulatory Visit: Payer: Self-pay

## 2018-05-05 ENCOUNTER — Ambulatory Visit: Payer: Federal, State, Local not specified - PPO | Admitting: Family Medicine

## 2018-05-05 VITALS — BP 118/82 | HR 81 | Temp 98.3°F | Resp 16 | Ht 72.0 in | Wt 277.2 lb

## 2018-05-05 DIAGNOSIS — G4719 Other hypersomnia: Secondary | ICD-10-CM | POA: Insufficient documentation

## 2018-05-05 DIAGNOSIS — G4733 Obstructive sleep apnea (adult) (pediatric): Secondary | ICD-10-CM

## 2018-05-05 DIAGNOSIS — R51 Headache: Secondary | ICD-10-CM

## 2018-05-05 DIAGNOSIS — R519 Headache, unspecified: Secondary | ICD-10-CM

## 2018-05-05 DIAGNOSIS — M5030 Other cervical disc degeneration, unspecified cervical region: Secondary | ICD-10-CM

## 2018-05-05 DIAGNOSIS — Z8719 Personal history of other diseases of the digestive system: Secondary | ICD-10-CM

## 2018-05-05 DIAGNOSIS — Z6837 Body mass index (BMI) 37.0-37.9, adult: Secondary | ICD-10-CM

## 2018-05-05 DIAGNOSIS — E66812 Obesity, class 2: Secondary | ICD-10-CM | POA: Insufficient documentation

## 2018-05-05 HISTORY — DX: Obstructive sleep apnea (adult) (pediatric): G47.33

## 2018-05-05 MED ORDER — TOPIRAMATE 50 MG PO TABS: 50.0000 mg | ORAL_TABLET | Freq: Two times a day (BID) | ORAL | 0 refills | Status: DC
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
## 2018-05-05 NOTE — Patient Instructions (Signed)
Headache Tips:  Keep a headache log: Record the date and time your headaches start and end, the severity of your pain (0-10), any possible triggers, what treatments you tried, and if any of those treatments worked.  Common Headache Triggers: Certain food additives, alcohol, artificial sweeteners (like aspartame), caffeine (overuse and when you stop suddenly), delayed or skipped meals, exercise, certain foods (chocolate, soft cheeses, red wine), bright lights, loud noises, menses, certain odors, oral contraceptives, depression and anxiety, sleep issues, smoke inhalation (smoking or second-hand smoke), stress, weather changes  Supplements known to reduce headaches and migraines are: Riboflavin - Vitamin B2 - goal is 200mg/day twice per day with food Magnesium - goal is 200mg twice per day with food OR MigreLief - 1 tablet twice per day with food. This is a combination of the Riboflavin and Magnesium. It is only available at some health food stores and from [employer].   Please work to reduce your stress by taking at least 30 minutes to exercise, stretch, and/or meditate every day.  Drink plenty of water throughout the day, do not skip meals, and eat healthy foods that are not fried, fatty, high in sugar, or processed.  

## 2018-05-23 DIAGNOSIS — G4733 Obstructive sleep apnea (adult) (pediatric): Secondary | ICD-10-CM | POA: Diagnosis not present

## 2018-06-02 ENCOUNTER — Ambulatory Visit (INDEPENDENT_AMBULATORY_CARE_PROVIDER_SITE_OTHER): Payer: Federal, State, Local not specified - PPO | Admitting: Internal Medicine

## 2018-06-02 DIAGNOSIS — G4733 Obstructive sleep apnea (adult) (pediatric): Secondary | ICD-10-CM

## 2018-06-02 NOTE — Progress Notes (Signed)
[facility]     Virtual Visit via Telephone Note I connected with pt on 06/02/18 at  2:45 PM EDT by telephone and verified that I am speaking with the correct person using two identifiers.   I discussed the limitations, risks, security and privacy concerns of performing an evaluation and management service by telephone and the availability of in person appointments. I also discussed with the patient that there may be a patient responsible charge related to this service. The patient expressed understanding and agreed to proceed. I discussed the assessment and treatment plan with the patient. The patient was provided an opportunity to ask questions and all were answered. The patient agreed with the plan and demonstrated an understanding of the instructions. Please see note below for further detail.    The patient was advised to call back or seek an in-person evaluation if the symptoms worsen or if the condition fails to improve as anticipated. This was initially planned as a web visit but patient was driving, therefore switched to a telephone visit after the patient pulled over.   I provided 12 minutes of non-face-to-face time during this encounter.   Shane CrutchPradeep Leondre Taul, MD    Assessment and Plan:  Obstructive Sleep Apnea.  -Doing better with cpap however compliance still a bit low.  --Discussed using CPAP more, he often takes it off after wearing for an hour, discussed the importance of wearing it the entire night.   Morning headaches. - Possibly related to sleep apnea, improved since using CPAP.    Return in about 1 year (around 06/02/2019).   Date: 06/02/2018  MRN# [id number] Sherian MaroonCarl Richard Babler 06/23/1970   Jabier Gaussarl Richard Jimmey Ralpharker is a 48 y.o. old male seen in consultation for chief complaint of:  Sleep apnea.      HPI:   His wife asked him to come because he snores a lot, he is sleepy during the day. He goes to bed at 9pm, wakes at 4:30am. He  was sent for a sleep study which showed moderate OSA with AHi of 17. He has been started on CPAP he is more awake during the day and is no longer snoring at night, he is also noticing that he is no longer having morning headaches.  He often takes it off after putting it on, but thinks he can wear it longer.   **CPAP download 05/02/2018-05/31/2018.  Usage greater than 4 hours is 14/30 days.  Average usage on days used is 5 hours 12 minutes.  Pressure ranges 5-15.  Median pressure 6, 95th percentile pressure 9, maximum pressure 11.  Leaks are within normal limits.  Residual AHI 0.9.  Overall this shows inadequate compliance with excellent control of obstructive sleep apnea. **HST 03/09/2018>> Moderate OSA with AHI of 17.   Medication:    Current Outpatient Medications:  .  topiramate (TOPAMAX) 50 MG tablet, Take 1 tablet (50 mg total) by mouth 2 (two) times daily., Disp: 90 tablet, Rfl: 0   Allergies:  Oxycodone  Review of Systems:  Constitutional: Feels well. Cardiovascular: Denies chest pain, exertional chest pain.  Pulmonary: Denies hemoptysis, pleuritic chest pain.   The remainder of systems were reviewed and were found to be negative other than what is documented in the HPI.      LABORATORY PANEL:   CBC No results for input(s): WBC, HGB, HCT, PLT in the last 168 hours. ------------------------------------------------------------------------------------------------------------------  Chemistries  No results for input(s): NA, K, CL, CO2, GLUCOSE, BUN, CREATININE, CALCIUM, MG, AST, ALT,  ALKPHOS, BILITOT in the last 168 hours.  Invalid input(s): GFRCGP ------------------------------------------------------------------------------------------------------------------  Cardiac Enzymes No results for input(s): TROPONINI in the last 168 hours. ------------------------------------------------------------  RADIOLOGY:  No results found.     Thank  you for the consultation and for  allowing [facility] to assist in the care of your patient. Our recommendations are noted above.  Please contact us if we can be of further service.   Wells Guiles, M.D., F.C.C.P.  Board Certified in Internal Medicine, Pulmonary Medicine, Critical Care Medicine, and Sleep Medicine.  [facility] Office Number: [phone]   06/02/2018

## 2018-06-18 ENCOUNTER — Telehealth: Payer: Self-pay | Admitting: Family Medicine

## 2018-06-18 DIAGNOSIS — R519 Headache, unspecified: Secondary | ICD-10-CM

## 2018-06-18 DIAGNOSIS — R51 Headache: Principal | ICD-10-CM

## 2018-06-18 NOTE — Telephone Encounter (Signed)
Please call patient and make sure he schedules his 3 month follow up with Dr. Carlynn Purl in late June

## 2018-06-18 NOTE — Telephone Encounter (Signed)
Mailbox full and could not leave message to schedule appt with [facility]

## 2018-06-22 DIAGNOSIS — G4733 Obstructive sleep apnea (adult) (pediatric): Secondary | ICD-10-CM | POA: Diagnosis not present
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
## 2019-03-24 DIAGNOSIS — G4733 Obstructive sleep apnea (adult) (pediatric): Secondary | ICD-10-CM | POA: Diagnosis not present

## 2019-07-02 DIAGNOSIS — G4733 Obstructive sleep apnea (adult) (pediatric): Secondary | ICD-10-CM | POA: Diagnosis not present

## 2019-11-16 ENCOUNTER — Telehealth: Payer: Self-pay | Admitting: Internal Medicine

## 2019-11-16 NOTE — Telephone Encounter (Signed)
Spoke to [employer] w/ [employer].  Faxing over CMN needed signature.  Let her know that VS is our primary sleep doc for [place].  Will await fax & give to [facility] to signature on 11/19/19.

## 2019-12-23 DIAGNOSIS — G4733 Obstructive sleep apnea (adult) (pediatric): Secondary | ICD-10-CM | POA: Diagnosis not present

## 2020-04-12 DIAGNOSIS — G4733 Obstructive sleep apnea (adult) (pediatric): Secondary | ICD-10-CM | POA: Diagnosis not present

## 2020-07-19 DIAGNOSIS — G4733 Obstructive sleep apnea (adult) (pediatric): Secondary | ICD-10-CM | POA: Diagnosis not present

## 2020-10-21 DIAGNOSIS — G4733 Obstructive sleep apnea (adult) (pediatric): Secondary | ICD-10-CM | POA: Diagnosis not present

## 2020-11-20 DIAGNOSIS — G4733 Obstructive sleep apnea (adult) (pediatric): Secondary | ICD-10-CM | POA: Diagnosis not present

## 2020-12-21 DIAGNOSIS — G4733 Obstructive sleep apnea (adult) (pediatric): Secondary | ICD-10-CM | POA: Diagnosis not present

## 2021-06-28 DIAGNOSIS — G4733 Obstructive sleep apnea (adult) (pediatric): Secondary | ICD-10-CM | POA: Diagnosis not present

## 2021-07-04 DIAGNOSIS — G4733 Obstructive sleep apnea (adult) (pediatric): Secondary | ICD-10-CM | POA: Diagnosis not present

## 2021-09-07 ENCOUNTER — Encounter: Payer: Self-pay | Admitting: Nurse Practitioner

## 2021-09-07 ENCOUNTER — Ambulatory Visit: Payer: Federal, State, Local not specified - PPO | Admitting: Nurse Practitioner

## 2021-09-07 ENCOUNTER — Other Ambulatory Visit: Payer: Self-pay

## 2021-09-07 VITALS — BP 124/82 | HR 94 | Temp 98.6°F | Resp 18 | Ht 72.0 in | Wt 297.4 lb

## 2021-09-07 DIAGNOSIS — R42 Dizziness and giddiness: Secondary | ICD-10-CM

## 2021-09-07 NOTE — Progress Notes (Addendum)
BP 124/82   Pulse 94   Temp 98.6 F (37 C) (Oral)   Resp 18   Ht 6' (1.829 m)   Wt 297 lb 6.4 oz (134.9 kg)   SpO2 96%   BMI 40.33 kg/m    Subjective:    Patient ID: Nicholas Patterson, male    DOB: [birth year], 51 y.o.   MRN: [id number]  HPI: Nicholas Patterson is a 51 y.o. male  Chief Complaint  Patient presents with   Dizziness    Started after starting Eliquis for blood clots   Dizziness: Patient reports that he had a DVT of left lower extremity.  Patient states he was put on eliquis 5 mg two times a day and started having dizzy spells.  Does see a family doctor in [place] his name is Dr. Chales Abrahams.  Patient states he was given meclizine but it made him too sleepy to be able to work. Patient says he is followed for  his DVT and eliquis with a provider in [place]. Patient states he is leaving for [place] again tomorrow.  Patient states that the dizziness is only in the morning when he first wakes up and then he is fine the rest of the day.  Patient states he does wear a CPAP machine at night and has frequent headaches but that his headaches have not changed since starting the eliquis.  He denies any chest pain, shortness of breath or any other neurological symptoms.  Neuro exam is completely within normal limits. Orthostatics obtained: Laying: 128/80, 100 Sitting:130/84, 102 Standing:124/84, 102  Will order labs, discussed he should see ENT.  Patient states he will either follow-up with Dr. Chales Abrahams in [place] or will send me a message regarding who he wants me to send an ENT referral to.   Relevant past medical, surgical, family and social history reviewed and updated as indicated. Interim medical history since our last visit reviewed. Allergies and medications reviewed and updated.  Review of Systems  Constitutional: Negative for fever or weight change.  Respiratory: Negative for cough and shortness of breath.   Cardiovascular: Negative for chest pain or palpitations.   Gastrointestinal: Negative for abdominal pain, no bowel changes.  Musculoskeletal: Negative for gait problem or joint swelling.  Skin: Negative for rash.  Neurological: positive for dizziness and headache (usual).  No other specific complaints in a complete review of systems (except as listed in HPI above).      Objective:    BP 124/82   Pulse 94   Temp 98.6 F (37 C) (Oral)   Resp 18   Ht 6' (1.829 m)   Wt 297 lb 6.4 oz (134.9 kg)   SpO2 96%   BMI 40.33 kg/m   Wt Readings from Last 3 Encounters:  09/07/21 297 lb 6.4 oz (134.9 kg)  05/05/18 277 lb 3.2 oz (125.7 kg)  03/05/18 276 lb (125.2 kg)    Physical Exam Constitutional: Patient appears well-developed and well-nourished. Obese  No distress.  HEENT: head atraumatic, normocephalic, pupils equal and reactive to light, ears TMs clear, neck supple, throat within normal limits Cardiovascular: Normal rate, regular rhythm and normal heart sounds.  No murmur heard. No BLE edema. Pulmonary/Chest: Effort normal and breath sounds normal. No respiratory distress. Abdominal: Soft.  There is no tenderness. Psychiatric: Patient has a normal mood and affect. behavior is normal. Judgment and thought content normal.  Results for orders placed or performed in visit on 01/24/18  HM COLONOSCOPY  Result Value Ref Range  HM Colonoscopy See Report (in chart) See Report (in chart), Patient Reported      Assessment & Plan:   Problem List Items Addressed This Visit   None Visit Diagnoses     Dizziness    -  Primary   Dizziness not likely due to Eliquis.  Most likely vertigo.  Commend patient see ear nose and throat.  Patient will contact us to let us know where to send refer   Relevant Orders   CBC with Differential/Platelet   COMPLETE METABOLIC PANEL WITH GFR        Follow up plan: No follow-ups on file.

## 2021-09-08 LAB — COMPLETE METABOLIC PANEL WITH GFR
AG Ratio: 1.6 (calc) (ref 1.0–2.5)
ALT: 71 U/L — ABNORMAL HIGH (ref 9–46)
AST: 40 U/L (ref 10–40)
Albumin: 4.3 g/dL (ref 3.6–5.1)
Alkaline phosphatase (APISO): 108 U/L (ref 36–130)
BUN: 14 mg/dL (ref 7–25)
CO2: 29 mmol/L (ref 20–32)
Calcium: 9.5 mg/dL (ref 8.6–10.3)
Chloride: 101 mmol/L (ref 98–110)
Creat: 1.15 mg/dL (ref 0.60–1.29)
Globulin: 2.7 g/dL (calc) (ref 1.9–3.7)
Glucose, Bld: 140 mg/dL — ABNORMAL HIGH (ref 65–99)
Potassium: 4.2 mmol/L (ref 3.5–5.3)
Sodium: 141 mmol/L (ref 135–146)
Total Bilirubin: 0.9 mg/dL (ref 0.2–1.2)
Total Protein: 7 g/dL (ref 6.1–8.1)
eGFR: 78 mL/min/{1.73_m2} (ref >=60)

## 2021-09-08 LAB — CBC WITH DIFFERENTIAL/PLATELET
Absolute Monocytes: 451 cells/uL (ref 200–950)
Basophils Absolute: 46 cells/uL (ref 0–200)
Basophils Relative: 0.5 %
Eosinophils Absolute: 110 cells/uL (ref 15–500)
Eosinophils Relative: 1.2 %
HCT: 48.9 % (ref 38.5–50.0)
Hemoglobin: 16.3 g/dL (ref 13.2–17.1)
Lymphs Abs: 2585 cells/uL (ref 850–3900)
MCH: 28.2 pg (ref 27.0–33.0)
MCHC: 33.3 g/dL (ref 32.0–36.0)
MCV: 84.6 fL (ref 80.0–100.0)
MPV: 10.4 fL (ref 7.5–12.5)
Monocytes Relative: 4.9 %
Neutro Abs: 6008 cells/uL (ref 1500–7800)
Neutrophils Relative %: 65.3 %
Platelets: 272 10*3/uL (ref 140–400)
RBC: 5.78 10*6/uL (ref 4.20–5.80)
RDW: 13.9 % (ref 11.0–15.0)
Total Lymphocyte: 28.1 %
WBC: 9.2 10*3/uL (ref 3.8–10.8)

## 2021-09-28 DIAGNOSIS — G4733 Obstructive sleep apnea (adult) (pediatric): Secondary | ICD-10-CM | POA: Diagnosis not present

## 2021-10-05 NOTE — Progress Notes (Signed)
Name: Nicholas Patterson   MRN: [id number]    DOB: 07/02/1970   Date:10/06/2021       Progress Note  Subjective  Chief Complaint  Dizziness  HPI  Dizziness: he has a pcp in [place] ( where he lives ) , over the past couple of months , he feels lightheaded, no sensation of spinning, denies hearing loss or otalgia. Denies headaches since compliant with CPAP machine. Symptoms resolves when he goes back to bad for a few minutes. Not associated to chest pain or confusion   Obesity: his weight is up over 20 lbs since 2020, he was going to the gym but stopped due to DVT on left lower leg. Diagnosed in [place]. He is on Eliquis. - he states it was unprovoked, advised to follow up with hematologist in [place]   Morbid obesity: BMI over 40, discussed importance of weight loss  OSA: he is compliant with CPAP, controlling his morning headaches and also snoring, states wife does not let him sleep in their bedroom without CPAP, he has lost to follow up with his pulmonologist, advised to schedule an appointment   Patient Active Problem List   Diagnosis Date Noted   Excessive daytime sleepiness 05/05/2018   OSA (obstructive sleep apnea) 05/05/2018   Class 2 severe obesity due to excess calories with serious comorbidity and body mass index (BMI) of 37.0 to 37.9 in adult [facility]) 05/05/2018   Chronic daily headache 02/26/2018   Loud snoring 02/26/2018   DDD (degenerative disc disease), cervical 01/20/2018   History of rectal bleeding 04/14/2014    Past Surgical History:  Procedure Laterality Date   COLONOSCOPY     HEMORRHOID SURGERY     HERNIA REPAIR      Family History  Problem Relation Age of Onset   COPD Mother    Cancer Father        Throat   Prostate cancer Neg Hx    Kidney disease Neg Hx     Social History   Tobacco Use   Smoking status: Former    Types: Cigarettes   Smokeless tobacco: Never  Substance Use Topics   Alcohol use: Yes    Comment: occasionally     Current  Outpatient Medications:    ELIQUIS 5 MG TABS tablet, Take by mouth 2 (two) times daily., Disp: , Rfl:   Allergies  Allergen Reactions   Codeine Nausea And Vomiting   Oxycodone Nausea And Vomiting    I personally reviewed active problem list, medication list, allergies, family history, social history, health maintenance with the patient/caregiver today.   ROS  Ten systems reviewed and is negative except as mentioned in HPI   Objective  Vitals:   10/06/21 1353  BP: 126/88  Pulse: 94  Resp: 16  SpO2: 95%  Weight: 300 lb (136.1 kg)  Height: 6' (1.829 m)    Body mass index is 40.69 kg/m.  Physical Exam  Constitutional: Patient appears well-developed and well-nourished. Obese  No distress.  HEENT: head atraumatic, normocephalic, pupils equal and reactive to light, ears normal TM, neck supple, throat within normal limits Cardiovascular: Normal rate, regular rhythm and normal heart sounds.  No murmur heard. No BLE edema. Pulmonary/Chest: Effort normal and breath sounds normal. No respiratory distress. Abdominal: Soft.  There is no tenderness. Neurological : romberg negative , no nystagmus, normal alternate movements, cranial nerve intact  Psychiatric: Patient has a normal mood and affect. behavior is normal. Judgment and thought content normal.   Recent Results (from the  past 2160 hour(s))  CBC with Differential/Platelet     Status: None   Collection Time: 09/07/21  2:50 PM  Result Value Ref Range   WBC 9.2 3.8 - 10.8 Thousand/uL   RBC 5.78 4.20 - 5.80 Million/uL   Hemoglobin 16.3 13.2 - 17.1 g/dL   HCT 48.9 38.5 - 50.0 %   MCV 84.6 80.0 - 100.0 fL   MCH 28.2 27.0 - 33.0 pg   MCHC 33.3 32.0 - 36.0 g/dL   RDW 13.9 11.0 - 15.0 %   Platelets 272 140 - 400 Thousand/uL   MPV 10.4 7.5 - 12.5 fL   Neutro Abs 6,008 1,500 - 7,800 cells/uL   Lymphs Abs 2,585 850 - 3,900 cells/uL   Absolute Monocytes 451 200 - 950 cells/uL   Eosinophils Absolute 110 15 - 500 cells/uL    Basophils Absolute 46 0 - 200 cells/uL   Neutrophils Relative % 65.3 %   Total Lymphocyte 28.1 %   Monocytes Relative 4.9 %   Eosinophils Relative 1.2 %   Basophils Relative 0.5 %  COMPLETE METABOLIC PANEL WITH GFR     Status: Abnormal   Collection Time: 09/07/21  2:50 PM  Result Value Ref Range   Glucose, Bld 140 (H) 65 - 99 mg/dL    Comment: .            Fasting reference interval . For someone without known diabetes, a glucose value >125 mg/dL indicates that they may have diabetes and this should be confirmed with a follow-up test. .    BUN 14 7 - 25 mg/dL   Creat 1.15 0.60 - 1.29 mg/dL   eGFR 78 > OR = 60 mL/min/1.94m    Comment: The eGFR is based on the CKD-EPI 2021 equation. To calculate  the new eGFR from a previous Creatinine or Cystatin C result, go to https://www.kidney.org/professionals/ kdoqi/gfr%5Fcalculator    BUN/Creatinine Ratio NOT APPLICABLE 6 - 22 (calc)   Sodium 141 135 - 146 mmol/L   Potassium 4.2 3.5 - 5.3 mmol/L   Chloride 101 98 - 110 mmol/L   CO2 29 20 - 32 mmol/L   Calcium 9.5 8.6 - 10.3 mg/dL   Total Protein 7.0 6.1 - 8.1 g/dL   Albumin 4.3 3.6 - 5.1 g/dL   Globulin 2.7 1.9 - 3.7 g/dL (calc)   AG Ratio 1.6 1.0 - 2.5 (calc)   Total Bilirubin 0.9 0.2 - 1.2 mg/dL   Alkaline phosphatase (APISO) 108 36 - 130 U/L   AST 40 10 - 40 U/L   ALT 71 (H) 9 - 46 U/L    PHQ2/9:    10/06/2021    1:59 PM 09/07/2021    2:17 PM 05/05/2018   11:24 AM 02/26/2018    7:40 AM 01/20/2018    3:26 PM  Depression screen PHQ 2/9  Decreased Interest 0 0 0 0 0  Down, Depressed, Hopeless 0 0 0 0 0  PHQ - 2 Score 0 0 0 0 0  Altered sleeping   0 0 0  Tired, decreased energy   0 0 1  Change in appetite   0 0 0  Feeling bad or failure about yourself    0 0 0  Trouble concentrating   0 0 0  Moving slowly or fidgety/restless   0 0 0  Suicidal thoughts   0 0 0  PHQ-9 Score   0 0 1  Difficult doing work/chores   Not difficult at all Not difficult at all Not difficult  at  all    phq 9 is negative   Fall Risk:    10/06/2021    1:59 PM 09/07/2021    2:16 PM 05/05/2018   11:24 AM 02/26/2018    7:40 AM 01/20/2018    3:26 PM  Fall Risk   Falls in the past year? 0 0 0 0 0  Number falls in past yr: 0 0 0 0 0  Injury with Fall? 0 0 0 0 0  Risk for fall due to : No Fall Risks      Follow up Falls prevention discussed Falls evaluation completed Falls evaluation completed Falls evaluation completed       Functional Status Survey: Is the patient deaf or have difficulty hearing?: No Does the patient have difficulty seeing, even when wearing glasses/contacts?: No Does the patient have difficulty concentrating, remembering, or making decisions?: No Does the patient have difficulty walking or climbing stairs?: No Does the patient have difficulty dressing or bathing?: No Does the patient have difficulty doing errands alone such as visiting a doctor's office or shopping?: No    Assessment & Plan  1. Dizziness  Get up slowly - sit on the edge of the bed, he has a pcp in [place] and advised to follow up there for labs   2. Acute deep vein thrombosis (DVT) of distal vein of left lower extremity ([place])   Needs to see hematologist since unprovoked DVT   3. Morbid obesity ([place])  Discussed with the patient the risk posed by an increased BMI. Discussed importance of portion control, calorie counting and at least 150 minutes of physical activity weekly. Avoid sweet beverages and drink more water. Eat at least 6 servings of fruit and vegetables daily      4. OSA on CPAP  Follow up with pulmonologist

## 2021-10-06 ENCOUNTER — Ambulatory Visit: Payer: Federal, State, Local not specified - PPO | Admitting: Family Medicine

## 2021-10-06 ENCOUNTER — Encounter: Payer: Self-pay | Admitting: Family Medicine

## 2021-10-06 VITALS — BP 126/88 | HR 94 | Resp 16 | Ht 72.0 in | Wt 300.0 lb

## 2021-10-06 DIAGNOSIS — M19012 Primary osteoarthritis, left shoulder: Secondary | ICD-10-CM | POA: Insufficient documentation

## 2021-10-06 DIAGNOSIS — I824Z2 Acute embolism and thrombosis of unspecified deep veins of left distal lower extremity: Secondary | ICD-10-CM | POA: Diagnosis not present

## 2021-10-06 DIAGNOSIS — R42 Dizziness and giddiness: Secondary | ICD-10-CM

## 2021-10-06 DIAGNOSIS — Z9989 Dependence on other enabling machines and devices: Secondary | ICD-10-CM

## 2021-10-06 DIAGNOSIS — G4733 Obstructive sleep apnea (adult) (pediatric): Secondary | ICD-10-CM

## 2021-10-06 NOTE — Patient Instructions (Signed)
Dr. Barrett Shell pneumatology

## 2021-10-06 NOTE — Addendum Note (Signed)
Addended by: Alba Cory F on: 10/06/2021 02:51 PM   Modules accepted: Level of Service

## 2021-10-11 ENCOUNTER — Emergency Department
Admission: EM | Admit: 2021-10-11 | Discharge: 2021-10-11 | Disposition: A | Discharge status: Home / Self Care | Payer: Self-pay | Attending: Emergency Medicine | Admitting: Emergency Medicine

## 2021-10-11 ENCOUNTER — Other Ambulatory Visit: Payer: Self-pay

## 2021-10-11 DIAGNOSIS — N3090 Cystitis, unspecified without hematuria: Secondary | ICD-10-CM | POA: Insufficient documentation

## 2021-10-11 LAB — URINALYSIS, ROUTINE W REFLEX MICROSCOPIC
Bilirubin Urine: NEGATIVE
Glucose, UA: NEGATIVE mg/dL
Ketones, ur: 20 mg/dL — AB
Nitrite: NEGATIVE
Protein, ur: >=300 mg/dL — AB
RBC / HPF: >50 RBC/hpf — ABNORMAL HIGH (ref 0–5)
Specific Gravity, Urine: 1.028 (ref 1.005–1.030)
WBC, UA: >50 WBC/hpf — ABNORMAL HIGH (ref 0–5)
pH: 5 (ref 5.0–8.0)

## 2021-10-11 LAB — CHLAMYDIA/NGC RT PCR (ARMC ONLY)
Chlamydia Tr: NOT DETECTED
N gonorrhoeae: DETECTED — AB

## 2021-10-11 MED ORDER — NAPROXEN 500 MG PO TABS: 500.0000 mg | ORAL_TABLET | Freq: Two times a day (BID) | ORAL | 0 refills | Status: AC

## 2021-10-11 MED ORDER — CEFTRIAXONE SODIUM 1 G IJ SOLR
500.0000 mg | Freq: Once | INTRAMUSCULAR | Status: AC
  Administered 2021-10-11: 500 mg via INTRAMUSCULAR
  Filled 2021-10-11: qty 10

## 2021-10-11 MED ORDER — AZITHROMYCIN 500 MG PO TABS
1000.0000 mg | ORAL_TABLET | Freq: Every day | ORAL | Status: DC
  Administered 2021-10-11: 1000 mg via ORAL
  Filled 2021-10-11: qty 2

## 2021-10-11 MED ORDER — CEPHALEXIN 500 MG PO CAPS: 500.0000 mg | ORAL_CAPSULE | Freq: Three times a day (TID) | ORAL | 0 refills | Status: AC

## 2021-10-11 MED ORDER — KETOROLAC TROMETHAMINE 15 MG/ML IJ SOLN
15.0000 mg | Freq: Once | INTRAMUSCULAR | Status: AC
  Administered 2021-10-11: 15 mg via INTRAMUSCULAR
  Filled 2021-10-11: qty 1

## 2021-10-11 NOTE — ED Provider Notes (Signed)
[facility] Provider Note    Event Date/Time   First MD Initiated Contact with Patient 10/11/21 1222     (approximate)   History   Chief Complaint: Abdominal Pain   HPI  Andrew Zuniga is a 51 y.o. male with no significant past medical history who comes the ED complaining of dysuria and urinary frequency that started today.  He has also noticed hematuria.  Pain is localized to the penis.  Denies any abdominal pain or flank pain.  No fever or vomiting.  No trauma.  Reviewing outside records from [facility], patient had gonorrhea in March 2022.  Patient reports last sexual encounter was 1 week ago.  Denies frank discharge     Physical Exam   Triage Vital Signs: ED Triage Vitals  Enc Vitals Group     BP 10/11/21 1145 (!) 169/105     Pulse Rate 10/11/21 1145 80     Resp 10/11/21 1145 19     Temp 10/11/21 1145 (!) 97.4 F (36.3 C)     Temp Source 10/11/21 1145 Oral     SpO2 10/11/21 1145 98 %     Weight 10/11/21 1146 275 lb (124.7 kg)     Height 10/11/21 1146 6\' 1"  (1.854 m)     Head Circumference --      Peak Flow --      Pain Score 10/11/21 1146 9     Pain Loc --      Pain Edu? --      Excl. in GC? --     Most recent vital signs: Vitals:   10/11/21 1245 10/11/21 1300  BP:  (!) 140/87  Pulse: 77 77  Resp:  18  Temp:    SpO2: 92% 93%    General: Awake, no distress.  CV:  Good peripheral perfusion.  Normal femoral pulses Resp:  Normal effort.  Abd:  No distention.  Soft and nontender.  No CVA tenderness Other:  Normal genitalia.  Scant discharge at the meatus.  Testicles nontender, scrotum noninflamed.   ED Results / Procedures / Treatments   Labs (all labs ordered are listed, but only abnormal results are displayed) Labs Reviewed  URINALYSIS, ROUTINE W REFLEX MICROSCOPIC - Abnormal; Notable for the following components:      Result Value   Color, Urine AMBER (*)    APPearance CLOUDY (*)    Hgb urine dipstick LARGE (*)    Ketones, ur  20 (*)    Protein, ur >=300 (*)    Leukocytes,Ua MODERATE (*)    RBC / HPF >50 (*)    WBC, UA >50 (*)    Bacteria, UA MANY (*)    Non Squamous Epithelial PRESENT (*)    All other components within normal limits  CHLAMYDIA/NGC RT PCR ([facility] ONLY)            URINE CULTURE     EKG    RADIOLOGY    PROCEDURES:  Procedures   MEDICATIONS ORDERED IN ED: Medications  azithromycin (ZITHROMAX) tablet 1,000 mg (1,000 mg Oral Given 10/11/21 1247)  ketorolac (TORADOL) 15 MG/ML injection 15 mg (15 mg Intramuscular Given 10/11/21 1248)  cefTRIAXone (ROCEPHIN) injection 500 mg (500 mg Intramuscular Given 10/11/21 1248)     IMPRESSION / MDM / ASSESSMENT AND PLAN / ED COURSE  I reviewed the triage vital signs and the nursing notes.  Differential diagnosis includes, but is not limited to, UTI, STI, urinary retention.  Doubt ureteral obstruction or pyelonephritis.  No evidence of scrotal or soft tissue infection.  No hernia  Patient's presentation is most consistent with acute presentation with potential threat to life or bodily function.  Patient presents with dysuria and frequency.  Vitals are normal.  He is ambulatory with very focal pain.  Exam is reassuring.  Urinalysis consistent with cystitis with microscopic hematuria.  We will add on GC chlamydia, obtain bladder scan.   ----------------------------------------- 2:18 PM on 10/11/2021 ----------------------------------------- Bladder scan negative.  Patient given Rocephin and azithromycin for STI coverage.  Will prescribe Keflex, can follow-up with PCP.      FINAL CLINICAL IMPRESSION(S) / ED DIAGNOSES   Final diagnoses:  Cystitis     Rx / DC Orders   ED Discharge Orders          Ordered    cephALEXin (KEFLEX) 500 MG capsule  3 times daily        10/11/21 1237    naproxen (NAPROSYN) 500 MG tablet  2 times daily with meals        10/11/21 1237             Note:  This document was  prepared using Dragon voice recognition software and may include unintentional dictation errors.   Sharman Cheek, MD 10/11/21 [phone]

## 2021-10-11 NOTE — ED Triage Notes (Signed)
Pt has had UTI's in the past. Pt states this feels the same. Pt has abdominal pain, frequent urination, retention, and blood in urine. Pt denies flank pain, nausea, vomiting, diarrhea, and fever.Marland Kitchen

## 2021-10-12 ENCOUNTER — Telehealth: Payer: Self-pay | Admitting: Emergency Medicine

## 2021-10-12 LAB — URINE CULTURE: Culture: NO GROWTH

## 2021-10-12 NOTE — Telephone Encounter (Addendum)
Called patient to inform of std results.  He was treated during visit.  No answer and no voicemail.  A person called back, but this number isnot mr parkers.  Letter sent to patient asking him to call me.

## 2021-10-19 ENCOUNTER — Ambulatory Visit (INDEPENDENT_AMBULATORY_CARE_PROVIDER_SITE_OTHER): Payer: Federal, State, Local not specified - PPO

## 2021-10-19 ENCOUNTER — Ambulatory Visit: Payer: Federal, State, Local not specified - PPO | Admitting: Podiatry

## 2021-10-19 DIAGNOSIS — M21619 Bunion of unspecified foot: Secondary | ICD-10-CM

## 2021-10-19 DIAGNOSIS — M21612 Bunion of left foot: Secondary | ICD-10-CM

## 2021-10-19 DIAGNOSIS — M2011 Hallux valgus (acquired), right foot: Secondary | ICD-10-CM

## 2021-10-19 DIAGNOSIS — M21611 Bunion of right foot: Secondary | ICD-10-CM

## 2021-10-19 DIAGNOSIS — M2012 Hallux valgus (acquired), left foot: Secondary | ICD-10-CM | POA: Diagnosis not present

## 2021-10-19 NOTE — Progress Notes (Signed)
  Subjective:  Patient ID: Nicholas Patterson, male    DOB: 03/30/1970,  MRN: [id number]  Chief Complaint  Patient presents with   Bunions    Bilateral bunions causing discomfort      51 y.o. male presents with the above complaint. Patient presents with bilateral bunion deformity of both feet. Have had bunions for many years. Now having more pain, worse in left foot. Has tried using wider shoes with no improvement. Interested in surgical correction. Denies history of DM. Does report taking elquis for blod clot in left leg.   Review of Systems: Negative except as noted in the HPI. Denies N/V/F/Ch.  Past Medical History:  Diagnosis Date   Blood clot in vein    left leg   DDD (degenerative disc disease), cervical 01/20/2018   OSA (obstructive sleep apnea) 05/05/2018    Current Outpatient Medications:    ELIQUIS 5 MG TABS tablet, Take by mouth 2 (two) times daily., Disp: , Rfl:   Social History   Tobacco Use  Smoking Status Former   Types: Cigarettes  Smokeless Tobacco Never    Allergies  Allergen Reactions   Codeine Nausea And Vomiting   Oxycodone Nausea And Vomiting   Objective:  There were no vitals filed for this visit. There is no height or weight on file to calculate BMI. Constitutional Well developed. Well nourished.  Vascular Dorsalis pedis pulses palpable bilaterally. Posterior tibial pulses palpable bilaterally. Capillary refill normal to all digits.  No cyanosis or clubbing noted. Pedal hair growth normal.  Neurologic Normal speech. Oriented to person, place, and time. Epicritic sensation to light touch grossly present bilaterally.  Dermatologic Nails well groomed and normal in appearance. No open wounds. No skin lesions.  Orthopedic: Normal joint ROM without pain or crepitus bilaterally. Hallux abductovalgus deformity present bilaterally, moderate to severe deformity Left 1st MPJ full range of motion without pain Left 1st TMT with gross  hypermobility. Right 1st MPJ full range of motion without pain Right 1st TMT with gross hypermobility. Lesser digital contractures absent bilaterally.   Radiographs: Taken and reviewed. Hallux abductovalgus deformity present. Metatarsal parabola normal. 1st/2nd IMA: 18 degrees left foot, 15 degrees right foot; TSP: 7 bilaterally  Assessment:   1. Bunion   2. Bunion, left foot   3. Bunion, right foot    Plan:  Patient was evaluated and treated and all questions answered.  Hallux abductovalgus deformity,  -XR as above. -Patient has failed all conservative therapy and wishes to proceed with surgical intervention. All risks, benefits, and alternatives discussed with patient. No guarantees given. Consent reviewed and signed by patient. Post-op course explained at length. -Planned procedures: Lapidus bunionectomy with akin osteotomy -Risk factors: Prior DVT in the Left lower extremity, on eliquis.   Pt to follow up after surgery, planning for early October.

## 2021-10-19 NOTE — Progress Notes (Deleted)
  Subjective:  Patient ID: Nicholas Patterson, male    DOB: 02-21-1970,  MRN: [id number]  No chief complaint on file.   51 y.o. male presents with the above complaint. ***  Review of Systems: Negative except as noted in the HPI. Denies N/V/F/Ch.  Past Medical History:  Diagnosis Date   Blood clot in vein    left leg   DDD (degenerative disc disease), cervical 01/20/2018   OSA (obstructive sleep apnea) 05/05/2018    Current Outpatient Medications:    ELIQUIS 5 MG TABS tablet, Take by mouth 2 (two) times daily., Disp: , Rfl:   Social History   Tobacco Use  Smoking Status Former   Types: Cigarettes  Smokeless Tobacco Never    Allergies  Allergen Reactions   Codeine Nausea And Vomiting   Oxycodone Nausea And Vomiting   Objective:  There were no vitals filed for this visit. There is no height or weight on file to calculate BMI. Constitutional Well developed. Well nourished.  Vascular Dorsalis pedis pulses palpable bilaterally. Posterior tibial pulses palpable bilaterally. Capillary refill normal to all digits.  No cyanosis or clubbing noted. Pedal hair growth normal.  Neurologic Normal speech. Oriented to person, place, and time. Epicritic sensation to light touch grossly present bilaterally.  Dermatologic Nails well groomed and normal in appearance. No open wounds. No skin lesions.  Orthopedic: Normal joint ROM without pain or crepitus bilaterally. Hallux abductovalgus deformity {absent/present:22727} Left 1st MPJ {Range of motion:16529}. Left 1st TMT {w-w/o:315700} gross hypermobility. Right 1st MPJ {Range of motion:16529}  Right 1st TMT {w-w/o:315700} gross hypermobility. Lesser digital contractures {absent/present:22727} {Left/right:33004}.   Radiographs: Taken and reviewed. Hallux abductovalgus deformity present. Metatarsal parabola {Normal/Abnormal Appearance:21344::"normal"}. 1st/2nd IMA: ***; TSP: ***  Assessment:  No diagnosis found. Plan:  Patient  was evaluated and treated and all questions answered.  Hallux abductovalgus deformity,  -XR as above. -Patient has failed all conservative therapy and wishes to proceed with surgical intervention. All risks, benefits, and alternatives discussed with patient. No guarantees given. Consent reviewed and signed by patient. Post-op course explained at length. -Planned procedures: *** -Risk factors: ***  No follow-ups on file.

## 2021-10-20 ENCOUNTER — Other Ambulatory Visit: Payer: Self-pay | Admitting: Podiatry

## 2021-10-20 DIAGNOSIS — M2012 Hallux valgus (acquired), left foot: Secondary | ICD-10-CM

## 2021-10-29 DIAGNOSIS — G4733 Obstructive sleep apnea (adult) (pediatric): Secondary | ICD-10-CM | POA: Diagnosis not present

## 2021-11-28 DIAGNOSIS — G4733 Obstructive sleep apnea (adult) (pediatric): Secondary | ICD-10-CM | POA: Diagnosis not present

## 2022-01-16 ENCOUNTER — Telehealth: Payer: Self-pay | Admitting: Podiatry

## 2022-01-16 NOTE — Telephone Encounter (Addendum)
DOS: 02/14/2022  Lapidus Procedure Including Bunionectomy Lt (93790) Double Osteotomy Lt (24097)  Deductible: $0 Out-of-Pocket: $6,500 with $0 met CoInsurance: 0%  Prior authorization is not required per [place]. Call Reference #: [id number]  Prior authorization is not required per [place].  Call Reference #: [id number]

## 2022-01-17 DIAGNOSIS — G4733 Obstructive sleep apnea (adult) (pediatric): Secondary | ICD-10-CM | POA: Diagnosis not present

## 2022-01-30 ENCOUNTER — Encounter: Payer: Self-pay | Admitting: Podiatry

## 2022-02-13 ENCOUNTER — Other Ambulatory Visit (INDEPENDENT_AMBULATORY_CARE_PROVIDER_SITE_OTHER): Payer: Federal, State, Local not specified - PPO | Admitting: Podiatry

## 2022-02-13 DIAGNOSIS — Z9889 Other specified postprocedural states: Secondary | ICD-10-CM

## 2022-02-13 DIAGNOSIS — M21612 Bunion of left foot: Secondary | ICD-10-CM

## 2022-02-13 MED ORDER — ONDANSETRON HCL 4 MG PO TABS: 4.0000 mg | ORAL_TABLET | Freq: Three times a day (TID) | ORAL | 0 refills | Status: DC | PRN

## 2022-02-13 MED ORDER — CEPHALEXIN 500 MG PO CAPS: 500.0000 mg | ORAL_CAPSULE | Freq: Three times a day (TID) | ORAL | 0 refills | Status: AC

## 2022-02-13 MED ORDER — OXYCODONE-ACETAMINOPHEN 5-325 MG PO TABS: 1.0000 | ORAL_TABLET | ORAL | 0 refills | Status: AC | PRN

## 2022-02-13 NOTE — Progress Notes (Signed)
Postop medications sent 

## 2022-02-14 ENCOUNTER — Encounter: Payer: Self-pay | Admitting: Podiatry

## 2022-02-14 ENCOUNTER — Other Ambulatory Visit (INDEPENDENT_AMBULATORY_CARE_PROVIDER_SITE_OTHER): Payer: Federal, State, Local not specified - PPO | Admitting: Podiatry

## 2022-02-14 DIAGNOSIS — M21612 Bunion of left foot: Secondary | ICD-10-CM | POA: Diagnosis not present

## 2022-02-14 DIAGNOSIS — M357 Hypermobility syndrome: Secondary | ICD-10-CM | POA: Diagnosis not present

## 2022-02-14 DIAGNOSIS — M21611 Bunion of right foot: Secondary | ICD-10-CM | POA: Diagnosis not present

## 2022-02-14 DIAGNOSIS — G8918 Other acute postprocedural pain: Secondary | ICD-10-CM | POA: Diagnosis not present

## 2022-02-14 DIAGNOSIS — Z9889 Other specified postprocedural states: Secondary | ICD-10-CM

## 2022-02-14 DIAGNOSIS — M2012 Hallux valgus (acquired), left foot: Secondary | ICD-10-CM | POA: Diagnosis not present

## 2022-02-14 MED ORDER — APIXABAN 2.5 MG PO TABS: 2.5000 mg | ORAL_TABLET | Freq: Two times a day (BID) | ORAL | 0 refills | Status: DC

## 2022-02-14 NOTE — Progress Notes (Signed)
Post op DVT ppx sent for patient. Eliquis 2.5 mg BID

## 2022-02-16 ENCOUNTER — Telehealth: Payer: Self-pay

## 2022-02-16 NOTE — Telephone Encounter (Signed)
POST OP CALL-    1) General condition stated by the patient: Patient states "I feel good, better than I expected".   2) Is the pt having pain? Patient states " I'm having a little pain, nothing out of control."  3) Pain score: 4 out of 10  4) Has the pt taken Rx'd pain medication, regularly or PRN? Patient reports "I was taking it every 4 hours but today I have only taking it as I needed".  5) Is the pain medication giving relief? Patient reports "Yes the pain medicine is helpful".  6) Any fever, chills, nausea, or vomiting, shortness of breath or tightness in calf? Patient denies the listed signs and symptoms.   7) Is the bandage clean, dry and intact? Patient confirms the bandage is clean, dry and intact.   8) Is there excessive tightness, bleeding or drainage coming through the bandage? Patient denies excessive tightness, bleeding or drainage from the bandage.  9) Did you understand all of the post op instruction sheet given? Patient states he did understand the post-op instructions.  10) Any questions or concerns regarding post op care/recovery? No questions or concerns voiced during the call.     Confirmed POV appointment with patient

## 2022-02-22 ENCOUNTER — Ambulatory Visit (INDEPENDENT_AMBULATORY_CARE_PROVIDER_SITE_OTHER): Payer: Federal, State, Local not specified - PPO | Admitting: Podiatry

## 2022-02-22 DIAGNOSIS — Z9889 Other specified postprocedural states: Secondary | ICD-10-CM

## 2022-02-22 DIAGNOSIS — M21612 Bunion of left foot: Secondary | ICD-10-CM

## 2022-02-22 NOTE — Progress Notes (Signed)
  Subjective:  Patient ID: Nicholas Patterson, male    DOB: 1971/01/11,  MRN: [id number]  Chief Complaint  Patient presents with   Post-op Follow-up    Post op #1 left foot bunionectomy.    DOS: 02/14/2022 Procedure: Left foot Lapidus bunionectomy and Akin osteotomy  52 y.o. male returns for post-op check.  Patient presents today for first postoperative examination status post left foot Lapidus and Aiken.  He has been nonweightbearing with the assist of crutches.  But in a posterior splint kept the dressing clean dry and intact as instructed.  Says pain is controlled no longer needing percocet.  Taking Eliquis 2.5 mg twice daily for DVT prophylaxis.  Review of Systems: Negative except as noted in the HPI. Denies N/V/F/Ch.   Objective:  There were no vitals filed for this visit. There is no height or weight on file to calculate BMI. Constitutional Well developed. Well nourished.  Vascular Foot warm and well perfused. Capillary refill normal to all digits.  Calf is soft and supple, no posterior calf or knee pain, negative Homans' sign  Neurologic Normal speech. Oriented to person, place, and time. Epicritic sensation to light touch grossly present bilaterally.  Dermatologic Skin healing well without signs of infection. Skin edges well coapted without signs of infection.  Orthopedic: Tenderness to palpation noted about the surgical site.   Multiple view plain film radiographs: XR left foot 3 views AP lateral bleak 02/22/2022.  Findings: Attention directed to the left midfoot.  This point screws across the first tarsometatarsal joint.  Akin osteotomy performed.  Hallux with residual valgus deformity. Assessment:   1. Post-operative state   2. Bunion, left foot    Plan:  Patient was evaluated and treated and all questions answered.  S/p foot surgery left foot Lapidus and Akin bunionectomy -Progressing as expected post-operatively. -XR: As above no acute postoperative complication -WB  Status: Nonweightbearing in cam boot which was dispensed to the patient this visit -Sutures: Remain intact until next visit. -Medications: Continue with DVT prophylaxis Eliquis 2.5 mg twice daily.  Continue with Percocet as needed for pain -Foot redressed.  Patient should keep this dressing clean dry and intact until his next visit in 1 week.  Return in about 1 week (around 03/01/2022) for 2nd POV L foot bunion.         Everitt Amber, DPM [facility]

## 2022-03-01 ENCOUNTER — Encounter: Payer: Self-pay | Admitting: Podiatry

## 2022-03-01 ENCOUNTER — Ambulatory Visit (INDEPENDENT_AMBULATORY_CARE_PROVIDER_SITE_OTHER): Payer: Federal, State, Local not specified - PPO | Admitting: Podiatry

## 2022-03-01 VITALS — BP 130/62 | HR 79

## 2022-03-01 DIAGNOSIS — M79676 Pain in unspecified toe(s): Secondary | ICD-10-CM

## 2022-03-01 DIAGNOSIS — Z9889 Other specified postprocedural states: Secondary | ICD-10-CM

## 2022-03-01 DIAGNOSIS — M21612 Bunion of left foot: Secondary | ICD-10-CM

## 2022-03-01 NOTE — Progress Notes (Signed)
  Subjective:  Patient ID: Nicholas Patterson, male    DOB: December 27, 1970,  MRN: [id number]  Chief Complaint  Patient presents with   Post-op Follow-up    Post op visit #2 left foot bunionectomy.patient states that foot is healing well.    DOS: 02/14/2022 Procedure: Left foot Lapidus bunionectomy and Akin osteotomy  52 y.o. male returns for second post-op check.  Patient presents today for first postoperative examination status post left foot Lapidus and Aiken.  He has been partial weightbearing with the assistance single crutch at this time using the cam boot.  This was against prior recommendation.  Has kept the dressing clean dry and intact since last visit.  Says pain is controlled not being any pain at this time.  Taking Eliquis 2.5 mg twice daily for DVT prophylaxis.  Review of Systems: Negative except as noted in the HPI. Denies N/V/F/Ch.   Objective:   Vitals:   03/01/22 0844  BP: 130/62  Pulse: 79   There is no height or weight on file to calculate BMI. Constitutional Well developed. Well nourished.  Vascular Foot warm and well perfused. Capillary refill normal to all digits.  Calf is soft and supple, no posterior calf or knee pain, negative Homans' sign  Neurologic Normal speech. Oriented to person, place, and time. Epicritic sensation to light touch grossly present bilaterally.  Dermatologic Skin healing well without signs of infection. Skin edges well coapted without signs of infection.  Improved healing from prior small eschar present along the incision line with no dehiscence or concern for infection.  Orthopedic: Minimal to no tenderness to palpation noted about the surgical site.   Multiple view plain film radiographs: Deferred at this visit Assessment:   1. Post-operative state   2. Bunion, left foot     Plan:  Patient was evaluated and treated and all questions answered.  S/p foot surgery left foot Lapidus and Akin bunionectomy -Progressing as expected  post-operatively.  Still with mild residual valgus of the hallux which I believe will be unfortunately permanent at this time.  Discussed possible intervention in the future as needed which would be fusion of the first MPJ to put the hallux in a more rectus position. -However discussed her primary outcome will be if the patient has any residual pain at the medial eminence of the first metatarsal head which she states he does not at this time and he says that it is not painful to touch and he thinks it will not be painful shoes he is overall satisfied. -XR: Deferred at this visit we will take some at next visit -WB Status: Continue nonweightbearing in the cam boot for another 2 weeks prior to transitioning to bearing in cam boot -Sutures: Suture tails was removed at this visit -Medications: Continue with DVT prophylaxis Eliquis 2.5 mg twice daily.  Continue with Percocet as needed for pain -Foot redressed.  Patient is now okay get the foot wet in the shower but dry off thoroughly and replace bandage for another week to 2 weeks.  Return in about 4 weeks (around 03/29/2022) for 3rd POV L foot Lapidus, akin.         Everitt Amber, DPM [facility]

## 2022-03-13 ENCOUNTER — Encounter: Payer: Federal, State, Local not specified - PPO | Admitting: Family Medicine

## 2022-03-23 NOTE — Progress Notes (Deleted)
Name: Nicholas Patterson   MRN: [id number]    DOB: 01-Jun-1970   Date:03/23/2022       Progress Note  Subjective  Chief Complaint  Annual Exam  HPI  Patient presents for annual CPE.  IPSS Questionnaire (AUA-7): Over the past month.   1)  How often have you had a sensation of not emptying your bladder completely after you finish urinating?  {Rating:19227}  2)  How often have you had to urinate again less than two hours after you finished urinating? {Rating:19227}  3)  How often have you found you stopped and started again several times when you urinated?  {Rating:19227}  4) How difficult have you found it to postpone urination?  {Rating:19227}  5) How often have you had a weak urinary stream?  {Rating:19227}  6) How often have you had to push or strain to begin urination?  {Rating:19227}  7) How many times did you most typically get up to urinate from the time you went to bed until the time you got up in the morning?  {Rating:19228}  Total score:  0-7 mildly symptomatic   8-19 moderately symptomatic   20-35 severely symptomatic     Diet: *** Exercise: *** Last Dental Exam: **** Last Eye Exam: ***  Depression: phq 9 is {gen pos neg:315643}    10/06/2021    1:59 PM 09/07/2021    2:17 PM 05/05/2018   11:24 AM 02/26/2018    7:40 AM 01/20/2018    3:26 PM  Depression screen PHQ 2/9  Decreased Interest 0 0 0 0 0  Down, Depressed, Hopeless 0 0 0 0 0  PHQ - 2 Score 0 0 0 0 0  Altered sleeping   0 0 0  Tired, decreased energy   0 0 1  Change in appetite   0 0 0  Feeling bad or failure about yourself    0 0 0  Trouble concentrating   0 0 0  Moving slowly or fidgety/restless   0 0 0  Suicidal thoughts   0 0 0  PHQ-9 Score   0 0 1  Difficult doing work/chores   Not difficult at all Not difficult at all Not difficult at all    Hypertension:  BP Readings from Last 3 Encounters:  03/01/22 130/62  10/06/21 126/88  09/07/21 124/82    Obesity: Wt Readings from Last 3 Encounters:   10/06/21 300 lb (136.1 kg)  09/07/21 297 lb 6.4 oz (134.9 kg)  05/05/18 277 lb 3.2 oz (125.7 kg)   BMI Readings from Last 3 Encounters:  10/06/21 40.69 kg/m  09/07/21 40.33 kg/m  05/05/18 37.60 kg/m     Lipids:  Lab Results  Component Value Date   CHOL 207 (H) 01/20/2018   Lab Results  Component Value Date   HDL 42 01/20/2018   Lab Results  Component Value Date   LDLCALC 122 (H) 01/20/2018   Lab Results  Component Value Date   TRIG 299 (H) 01/20/2018   Lab Results  Component Value Date   CHOLHDL 4.9 01/20/2018   No results found for: "LDLDIRECT" Glucose:  Glucose, Bld  Date Value Ref Range Status  09/07/2021 140 (H) 65 - 99 mg/dL Final    Comment:    .            Fasting reference interval . For someone without known diabetes, a glucose value >125 mg/dL indicates that they may have diabetes and this should be confirmed with a follow-up test. .  01/20/2018 104 65 - 139 mg/dL Final    Comment:    .        Non-fasting reference interval .   09/09/2016 115 (H) 65 - 99 mg/dL Final    Flowsheet Row Office Visit from 09/07/2021 in [facility]  AUDIT-C Score 0      Married STD testing and prevention (HIV/chl/gon/syphilis): 01/20/18 Sexual history:  Hep C Screening: N/A Skin cancer: Discussed monitoring for atypical lesions Colorectal cancer: 05/02/17 Prostate cancer:   No results found for: "PSA"   Lung cancer:  Low Dose CT Chest recommended if Age 26-80 years, 30 pack-year currently smoking OR have quit w/in 15years. Patient  no a candidate for screening   AAA: The USPSTF recommends one-time screening with ultrasonography in men ages 22 to 52 years who have ever smoked. Patient   no, a candidate for screening  ECG:  06/21/10  Vaccines:   HPV: N/A Tdap: up to date Shingrix: N/A Pneumonia: N/A Flu: 2019, declined COVID-19: 1 dose Pfizer  Advanced Care Planning: A voluntary discussion about advance care planning  including the explanation and discussion of advance directives.  Discussed health care proxy and Living will, and the patient was able to identify a health care proxy as ***.  Patient does not have a living will and power of attorney of health care   Patient Active Problem List   Diagnosis Date Noted   Acute deep vein thrombosis (DVT) of distal vein of left lower extremity ([place]) 10/06/2021   Arthritis of left shoulder region 10/06/2021   OSA (obstructive sleep apnea) 05/05/2018   DDD (degenerative disc disease), cervical 01/20/2018    Past Surgical History:  Procedure Laterality Date   COLONOSCOPY     HEMORRHOID SURGERY     HERNIA REPAIR      Family History  Problem Relation Age of Onset   COPD Mother    Cancer Father        Throat   Prostate cancer Neg Hx    Kidney disease Neg Hx     Social History   Socioeconomic History   Marital status: Married    Spouse name: [name]   Number of children: 3   Years of education: Not on file   Highest education level: High school graduate  Occupational History   Not on file  Tobacco Use   Smoking status: Former    Types: Cigarettes   Smokeless tobacco: Never  Vaping Use   Vaping Use: Never used  Substance and Sexual Activity   Alcohol use: Yes    Comment: occasionally   Drug use: No   Sexual activity: Yes    Partners: Female    Birth control/protection: None  Other Topics Concern   Not on file  Social History Narrative   Husband of patient, [name] and son-in-law of Ms. Julious Payer   3 children    Social Determinants of Health   Financial Resource Strain: Low Risk  (01/20/2018)   Overall Financial Resource Strain (CARDIA)    Difficulty of Paying Living Expenses: Not hard at all  Food Insecurity: No Food Insecurity (01/20/2018)   Hunger Vital Sign    Worried About Running Out of Food in the Last Year: Never true    Ran Out of Food in the Last Year: Never true  Transportation Needs: No Transportation Needs (01/20/2018)    PRAPARE - Hydrologist (Medical): No    Lack of Transportation (Non-Medical): No  Physical Activity: Inactive (01/20/2018)   Exercise Vital Sign    Days of Exercise per Week: 0 days    Minutes of Exercise per Session: 0 min  Stress: No Stress Concern Present (01/20/2018)   [place]    Feeling of Stress : Not at all  Social Connections: Moderately Integrated (01/20/2018)   Social Connection and Isolation Panel [NHANES]    Frequency of Communication with Friends and Family: More than three times a week    Frequency of Social Gatherings with Friends and Family: Never    Attends Religious Services: More than 4 times per year    Active Member of [employer] or Organizations: No    Attends Archivist Meetings: Never    Marital Status: Married  Human resources officer Violence: Not At Risk (01/20/2018)   Humiliation, Afraid, Rape, and Kick questionnaire    Fear of Current or Ex-Partner: No    Emotionally Abused: No    Physically Abused: No    Sexually Abused: No     Current Outpatient Medications:    apixaban (ELIQUIS) 2.5 MG TABS tablet, Take 1 tablet (2.5 mg total) by mouth 2 (two) times daily., Disp: 60 tablet, Rfl: 0   ELIQUIS 5 MG TABS tablet, Take by mouth 2 (two) times daily., Disp: , Rfl:    ondansetron (ZOFRAN) 4 MG tablet, Take 1 tablet (4 mg total) by mouth every 8 (eight) hours as needed for nausea or vomiting., Disp: 20 tablet, Rfl: 0  Allergies  Allergen Reactions   Codeine Nausea And Vomiting   Oxycodone Nausea And Vomiting     ROS  ***   Objective  There were no vitals filed for this visit.  There is no height or weight on file to calculate BMI.  Physical Exam ***  No results found for this or any previous visit (from the past 2160 hour(s)).   Fall Risk:    10/06/2021    1:59 PM 09/07/2021    2:16 PM 05/05/2018   11:24 AM 02/26/2018    7:40 AM 01/20/2018     3:26 PM  Fall Risk   Falls in the past year? 0 0 0 0 0  Number falls in past yr: 0 0 0 0 0  Injury with Fall? 0 0 0 0 0  Risk for fall due to : No Fall Risks      Follow up Falls prevention discussed Falls evaluation completed Falls evaluation completed Falls evaluation completed      Functional Status Survey:      Assessment & Plan  1. Well adult exam ***    -Prostate cancer screening and PSA options (with potential risks and benefits of testing vs not testing) were discussed along with recent recs/guidelines. -USPSTF grade A and B recommendations reviewed with patient; age-appropriate recommendations, preventive care, screening tests, etc discussed and encouraged; healthy living encouraged; see AVS for patient education given to patient -Discussed importance of 150 minutes of physical activity weekly, eat two servings of fish weekly, eat one serving of tree nuts ( cashews, pistachios, pecans, almonds.Marland Kitchen) every other day, eat 6 servings of fruit/vegetables daily and drink plenty of water and avoid sweet beverages.  -Reviewed Health Maintenance: yes

## 2022-03-23 NOTE — Patient Instructions (Incomplete)

## 2022-03-26 ENCOUNTER — Encounter: Payer: Federal, State, Local not specified - PPO | Admitting: Family Medicine

## 2022-03-26 DIAGNOSIS — Z0000 Encounter for general adult medical examination without abnormal findings: Secondary | ICD-10-CM

## 2022-03-26 NOTE — Progress Notes (Unsigned)
Name: Nicholas Patterson   MRN: [id number]    DOB: 1970/10/11   Date:03/27/2022       Progress Note  Subjective  Chief Complaint  Annual Exam  HPI  Patient presents for annual CPE.  IPSS Questionnaire (AUA-7): Over the past month.   1)  How often have you had a sensation of not emptying your bladder completely after you finish urinating?  0 - Not at all  2)  How often have you had to urinate again less than two hours after you finished urinating? 0 - Not at all  3)  How often have you found you stopped and started again several times when you urinated?  0 - Not at all  4) How difficult have you found it to postpone urination?  0 - Not at all  5) How often have you had a weak urinary stream?  0 - Not at all  6) How often have you had to push or strain to begin urination?  0 - Not at all  7) How many times did you most typically get up to urinate from the time you went to bed until the time you got up in the morning?  0 - None  Total score:  0-7 mildly symptomatic   8-19 moderately symptomatic   20-35 severely symptomatic     Diet: he cooks at home most of the time Exercise: he had surgery on left foot and has not been active over the past month  Last Dental Exam: up to date  Last Eye Exam: up to date   Depression: phq 9 is negative    03/27/2022    9:26 AM 10/06/2021    1:59 PM 09/07/2021    2:17 PM 05/05/2018   11:24 AM 02/26/2018    7:40 AM  Depression screen PHQ 2/9  Decreased Interest 0 0 0 0 0  Down, Depressed, Hopeless 0 0 0 0 0  PHQ - 2 Score 0 0 0 0 0  Altered sleeping 0   0 0  Tired, decreased energy 0   0 0  Change in appetite 0   0 0  Feeling bad or failure about yourself  0   0 0  Trouble concentrating 0   0 0  Moving slowly or fidgety/restless 0   0 0  Suicidal thoughts 0   0 0  PHQ-9 Score 0   0 0  Difficult doing work/chores    Not difficult at all Not difficult at all    Hypertension:  BP Readings from Last 3 Encounters:  03/27/22 122/84  03/01/22  130/62  10/06/21 126/88    Obesity: Wt Readings from Last 3 Encounters:  03/27/22 (!) 314 lb (142.4 kg)  10/06/21 300 lb (136.1 kg)  09/07/21 297 lb 6.4 oz (134.9 kg)   BMI Readings from Last 3 Encounters:  03/27/22 41.43 kg/m  10/06/21 40.69 kg/m  09/07/21 40.33 kg/m     Lipids:  Lab Results  Component Value Date   CHOL 207 (H) 01/20/2018   Lab Results  Component Value Date   HDL 42 01/20/2018   Lab Results  Component Value Date   LDLCALC 122 (H) 01/20/2018   Lab Results  Component Value Date   TRIG 299 (H) 01/20/2018   Lab Results  Component Value Date   CHOLHDL 4.9 01/20/2018   No results found for: "LDLDIRECT" Glucose:  Glucose, Bld  Date Value Ref Range Status  09/07/2021 140 (H) 65 - 99 mg/dL Final  Comment:    .            Fasting reference interval . For someone without known diabetes, a glucose value >125 mg/dL indicates that they may have diabetes and this should be confirmed with a follow-up test. .   01/20/2018 104 65 - 139 mg/dL Final    Comment:    .        Non-fasting reference interval .   09/09/2016 115 (H) 65 - 99 mg/dL Final    Flowsheet Row Office Visit from 09/07/2021 in [facility]  AUDIT-C Score 0      Married STD testing and prevention (HIV/chl/gon/syphilis): 01/20/18 Sexual history: one partner, married  Hep C Screening: today  Skin cancer: Discussed monitoring for atypical lesions Colorectal cancer: 05/02/17 Prostate cancer: N/A  Lung cancer:  Low Dose CT Chest recommended if Age 9-80 years, 30 pack-year currently smoking OR have quit w/in 15years. Patient  no a candidate for screening   AAA: The USPSTF recommends one-time screening with ultrasonography in men ages 13 to 23 years who have ever smoked. Patient  is not  a candidate for screening due to his age   ECG:  06/21/10  Vaccines:   Tdap: up to date Shingrix: go to pharmacy when he turns 52  Pneumonia: N/A Flu: 2019,  declined COVID-19: 1 dose [place]: A voluntary discussion about advance care planning including the explanation and discussion of advance directives.  Discussed health care proxy and Living will, and the patient was able to identify a health care proxy as wife.  Patient does not have a living will and power of attorney of health care   Patient Active Problem List   Diagnosis Date Noted   Acute deep vein thrombosis (DVT) of distal vein of left lower extremity ([place]) 10/06/2021   Arthritis of left shoulder region 10/06/2021   OSA (obstructive sleep apnea) 05/05/2018   DDD (degenerative disc disease), cervical 01/20/2018    Past Surgical History:  Procedure Laterality Date   COLONOSCOPY     HEMORRHOID SURGERY     HERNIA REPAIR      Family History  Problem Relation Age of Onset   COPD Mother    Cancer Father        Throat   Prostate cancer Neg Hx    Kidney disease Neg Hx     Social History   Socioeconomic History   Marital status: Married    Spouse name: [name]   Number of children: 3   Years of education: Not on file   Highest education level: High school graduate  Occupational History   Not on file  Tobacco Use   Smoking status: Former    Types: Cigarettes   Smokeless tobacco: Never  Vaping Use   Vaping Use: Never used  Substance and Sexual Activity   Alcohol use: Yes    Comment: occasionally   Drug use: No   Sexual activity: Yes    Partners: Female    Birth control/protection: None  Other Topics Concern   Not on file  Social History Narrative   Husband of patient, Sushanth Digangi and son-in-law of Ms. Julious Payer   3 children    Social Determinants of Health   Financial Resource Strain: Low Risk  (03/27/2022)   Overall Financial Resource Strain (CARDIA)    Difficulty of Paying Living Expenses: Not hard at all  Food Insecurity: No Food Insecurity (03/27/2022)   Hunger Vital Sign  Worried About Charity fundraiser in the Last Year: Never  true    [place] in the Last Year: Never true  Transportation Needs: No Transportation Needs (03/27/2022)   PRAPARE - Hydrologist (Medical): No    Lack of Transportation (Non-Medical): No  Physical Activity: Inactive (03/27/2022)   Exercise Vital Sign    Days of Exercise per Week: 0 days    Minutes of Exercise per Session: 0 min  Stress: No Stress Concern Present (03/27/2022)   [place]    Feeling of Stress : Not at all  Social Connections: Moderately Integrated (03/27/2022)   Social Connection and Isolation Panel [NHANES]    Frequency of Communication with Friends and Family: More than three times a week    Frequency of Social Gatherings with Friends and Family: Once a week    Attends Religious Services: 1 to 4 times per year    Active Member of [employer] or Organizations: No    Attends Archivist Meetings: Never    Marital Status: Married  Human resources officer Violence: Not At Risk (03/27/2022)   Humiliation, Afraid, Rape, and Kick questionnaire    Fear of Current or Ex-Partner: No    Emotionally Abused: No    Physically Abused: No    Sexually Abused: No    No current outpatient medications on file.  Allergies  Allergen Reactions   Codeine Nausea And Vomiting   Oxycodone Nausea And Vomiting     ROS  Constitutional: Negative for fever or weight change.  Respiratory: Negative for cough and shortness of breath.   Cardiovascular: Negative for chest pain or palpitations.  Gastrointestinal: Negative for abdominal pain, no bowel changes.  Musculoskeletal: Negative for gait problem or joint swelling.  Skin: Negative for rash.  Neurological: Negative for dizziness or headache.  No other specific complaints in a complete review of systems (except as listed in HPI above).    Objective  Vitals:   03/27/22 0927  BP: 122/84  Pulse: 91  Resp: 16  SpO2: 94%  Weight: (!) 314  lb (142.4 kg)  Height: 6' 1"$  (1.854 m)    Body mass index is 41.43 kg/m.  Physical Exam  Constitutional: Patient appears well-developed and well-nourished. No distress.  HENT: Head: Normocephalic and atraumatic. Ears: B TMs ok, no erythema or effusion; Nose: Nose normal. Mouth/Throat: Oropharynx is clear and moist. No oropharyngeal exudate.  Eyes: Conjunctivae and EOM are normal. Pupils are equal, round, and reactive to light. No scleral icterus.  Neck: Normal range of motion. Neck supple. No JVD present. No thyromegaly present.  Cardiovascular: Normal rate, regular rhythm and normal heart sounds.  No murmur heard. No BLE edema. Pulmonary/Chest: Effort normal and breath sounds normal. No respiratory distress. Abdominal: Soft. Bowel sounds are normal, no distension. There is no tenderness. no masses MALE GENITALIA: Normal descended testes bilaterally, no masses palpated, no hernias, no lesions, no discharge RECTAL:not done  Musculoskeletal: Normal range of motion, no joint effusions. wearing a ortho boot right foot due to recent surgery  Neurological: he is alert and oriented to person, place, and time. No cranial nerve deficit. Coordination, balance, strength, speech and gait are normal.  Skin: Skin is warm and dry. No rash noted. No erythema.  Psychiatric: Patient has a normal mood and affect. behavior is normal. Judgment and thought content normal.   Fall Risk:    03/27/2022    9:26 AM 10/06/2021  1:59 PM 09/07/2021    2:16 PM 05/05/2018   11:24 AM 02/26/2018    7:40 AM  Fall Risk   Falls in the past year? 0 0 0 0 0  Number falls in past yr: 0 0 0 0 0  Injury with Fall? 0 0 0 0 0  Risk for fall due to : No Fall Risks No Fall Risks     Follow up Falls prevention discussed Falls prevention discussed Falls evaluation completed Falls evaluation completed Falls evaluation completed     Functional Status Survey: Is the patient deaf or have difficulty hearing?: No Does the patient  have difficulty seeing, even when wearing glasses/contacts?: No Does the patient have difficulty concentrating, remembering, or making decisions?: No Does the patient have difficulty walking or climbing stairs?: No Does the patient have difficulty dressing or bathing?: No Does the patient have difficulty doing errands alone such as visiting a doctor's office or shopping?: No    Assessment & Plan  1. Well adult exam  - Hepatitis C antibody - Hemoglobin A1c - Lipid panel - COMPLETE METABOLIC PANEL WITH GFR - CBC with Differential/Platelet  2. Need for hepatitis C screening test  - Hepatitis C antibody  3. Dyslipidemia  - Lipid panel  4. Diabetes mellitus screening  - Hemoglobin A1c  5. Long-term use of high-risk medication  - COMPLETE METABOLIC PANEL WITH GFR - CBC with Differential/Platelet   6. Request for sterilization  - Ambulatory referral to Urology    -Prostate cancer screening and PSA options (with potential risks and benefits of testing vs not testing) were discussed along with recent recs/guidelines. -USPSTF grade A and B recommendations reviewed with patient; age-appropriate recommendations, preventive care, screening tests, etc discussed and encouraged; healthy living encouraged; see AVS for patient education given to patient -Discussed importance of 150 minutes of physical activity weekly, eat two servings of fish weekly, eat one serving of tree nuts ( cashews, pistachios, pecans, almonds.Marland Kitchen) every other day, eat 6 servings of fruit/vegetables daily and drink plenty of water and avoid sweet beverages.  -Reviewed Health Maintenance: yes

## 2022-03-26 NOTE — Patient Instructions (Signed)

## 2022-03-27 ENCOUNTER — Encounter: Payer: Self-pay | Admitting: Family Medicine

## 2022-03-27 ENCOUNTER — Ambulatory Visit (INDEPENDENT_AMBULATORY_CARE_PROVIDER_SITE_OTHER): Payer: Federal, State, Local not specified - PPO | Admitting: Family Medicine

## 2022-03-27 VITALS — BP 122/84 | HR 91 | Resp 16 | Ht 73.0 in | Wt 314.0 lb

## 2022-03-27 DIAGNOSIS — Z1159 Encounter for screening for other viral diseases: Secondary | ICD-10-CM

## 2022-03-27 DIAGNOSIS — Z131 Encounter for screening for diabetes mellitus: Secondary | ICD-10-CM

## 2022-03-27 DIAGNOSIS — Z79899 Other long term (current) drug therapy: Secondary | ICD-10-CM | POA: Diagnosis not present

## 2022-03-27 DIAGNOSIS — E785 Hyperlipidemia, unspecified: Secondary | ICD-10-CM | POA: Diagnosis not present

## 2022-03-27 DIAGNOSIS — Z86718 Personal history of other venous thrombosis and embolism: Secondary | ICD-10-CM | POA: Insufficient documentation

## 2022-03-27 DIAGNOSIS — Z0000 Encounter for general adult medical examination without abnormal findings: Secondary | ICD-10-CM | POA: Diagnosis not present

## 2022-03-27 DIAGNOSIS — Z302 Encounter for sterilization: Secondary | ICD-10-CM

## 2022-03-28 ENCOUNTER — Encounter: Payer: Self-pay | Admitting: Family Medicine

## 2022-03-28 DIAGNOSIS — R7303 Prediabetes: Secondary | ICD-10-CM | POA: Insufficient documentation

## 2022-03-28 LAB — LIPID PANEL
Cholesterol: 245 mg/dL — ABNORMAL HIGH (ref <200)
HDL: 56 mg/dL (ref >=40)
LDL Cholesterol (Calc): 164 mg/dL (calc) — ABNORMAL HIGH
Non-HDL Cholesterol (Calc): 189 mg/dL (calc) — ABNORMAL HIGH (ref <130)
Total CHOL/HDL Ratio: 4.4 (calc) (ref <5.0)
Triglycerides: 127 mg/dL (ref <150)

## 2022-03-28 LAB — CBC WITH DIFFERENTIAL/PLATELET
Absolute Monocytes: 441 cells/uL (ref 200–950)
Basophils Absolute: 49 cells/uL (ref 0–200)
Basophils Relative: 0.7 %
Eosinophils Absolute: 238 cells/uL (ref 15–500)
Eosinophils Relative: 3.4 %
HCT: 43.9 % (ref 38.5–50.0)
Hemoglobin: 14.7 g/dL (ref 13.2–17.1)
Lymphs Abs: 2632 cells/uL (ref 850–3900)
MCH: 27.8 pg (ref 27.0–33.0)
MCHC: 33.5 g/dL (ref 32.0–36.0)
MCV: 83.1 fL (ref 80.0–100.0)
MPV: 10.4 fL (ref 7.5–12.5)
Monocytes Relative: 6.3 %
Neutro Abs: 3640 cells/uL (ref 1500–7800)
Neutrophils Relative %: 52 %
Platelets: 254 10*3/uL (ref 140–400)
RBC: 5.28 10*6/uL (ref 4.20–5.80)
RDW: 14 % (ref 11.0–15.0)
Total Lymphocyte: 37.6 %
WBC: 7 10*3/uL (ref 3.8–10.8)

## 2022-03-28 LAB — COMPLETE METABOLIC PANEL WITH GFR
AG Ratio: 1.4 (calc) (ref 1.0–2.5)
ALT: 27 U/L (ref 9–46)
AST: 19 U/L (ref 10–40)
Albumin: 4.1 g/dL (ref 3.6–5.1)
Alkaline phosphatase (APISO): 86 U/L (ref 36–130)
BUN: 13 mg/dL (ref 7–25)
CO2: 29 mmol/L (ref 20–32)
Calcium: 9.7 mg/dL (ref 8.6–10.3)
Chloride: 104 mmol/L (ref 98–110)
Creat: 1.14 mg/dL (ref 0.60–1.29)
Globulin: 2.9 g/dL (calc) (ref 1.9–3.7)
Glucose, Bld: 98 mg/dL (ref 65–99)
Potassium: 4.6 mmol/L (ref 3.5–5.3)
Sodium: 142 mmol/L (ref 135–146)
Total Bilirubin: 0.8 mg/dL (ref 0.2–1.2)
Total Protein: 7 g/dL (ref 6.1–8.1)
eGFR: 79 mL/min/{1.73_m2} (ref >=60)

## 2022-03-28 LAB — HEMOGLOBIN A1C
Hgb A1c MFr Bld: 6.4 % of total Hgb — ABNORMAL HIGH (ref <5.7)
Mean Plasma Glucose: 137 mg/dL
eAG (mmol/L): 7.6 mmol/L

## 2022-03-28 LAB — HEPATITIS C ANTIBODY: Hepatitis C Ab: NONREACTIVE

## 2022-03-29 ENCOUNTER — Encounter: Payer: Self-pay | Admitting: Podiatry

## 2022-03-29 ENCOUNTER — Telehealth: Payer: Self-pay | Admitting: Podiatry

## 2022-03-29 ENCOUNTER — Ambulatory Visit (INDEPENDENT_AMBULATORY_CARE_PROVIDER_SITE_OTHER): Payer: Federal, State, Local not specified - PPO

## 2022-03-29 ENCOUNTER — Ambulatory Visit (INDEPENDENT_AMBULATORY_CARE_PROVIDER_SITE_OTHER): Payer: Federal, State, Local not specified - PPO | Admitting: Podiatry

## 2022-03-29 DIAGNOSIS — M21612 Bunion of left foot: Secondary | ICD-10-CM

## 2022-03-29 DIAGNOSIS — Z9889 Other specified postprocedural states: Secondary | ICD-10-CM

## 2022-03-29 NOTE — Progress Notes (Signed)
  Subjective:  Patient ID: Nicholas Patterson, male    DOB: June 06, 1970,  MRN: [id number]  Chief Complaint  Patient presents with   Routine Post Op    Patient states that he is not having any pain but minor swelling. No drainage.    DOS: 02/14/2022 Procedure: Left foot Lapidus bunionectomy and Akin osteotomy  52 y.o. male returns for third post-op check now approximately 6 weeks postop.  status post left foot Lapidus and Aiken.  He has been weightbearing in cam boot.  Patient states he has been doing very well he denies any complaints at this time he is very satisfied with surgical result.  He has been putting an Ace wrap over his foot but says the swelling has come down a lot since last time.  He has no pain at all when he is walking the cam boot.  He is hopeful to go back to work.  Review of Systems: Negative except as noted in the HPI. Denies N/V/F/Ch.   Objective:   There were no vitals filed for this visit.  There is no height or weight on file to calculate BMI. Constitutional Well developed. Well nourished.  Vascular Foot warm and well perfused. Capillary refill normal to all digits.  Calf is soft and supple, no posterior calf or knee pain, negative Homans' sign  Neurologic Normal speech. Oriented to person, place, and time. Epicritic sensation to light touch grossly present bilaterally.  Dermatologic Skin well-healed at this time.  Orthopedic: no tenderness to palpation noted about the surgical site at the medial eminence.   Multiple view plain film radiographs: 03/29/2022 XR 3 views AP lateral bleak of the left foot.  Attention directed to the first tarsometatarsal joint there is noted to be increased osseous infill across the joint especially lateral oblique views from prior.  Hardware is intact with no evidence of disruption or breakage.  Staples intact in the proximal phalanx for [place].  Residual hallux valgus deformity is noted. Assessment:   1. Post-operative state   2.  Bunion, left foot      Plan:  Patient was evaluated and treated and all questions answered.  S/p foot surgery left foot Lapidus and Akin bunionectomy -Progressing as expected post-operatively.  Still with mild residual valgus of the hallux though this does not bother the patient at all he is very satisfied with the surgical outcome. -He denies any pain in the left foot at the medial eminence or at the first TMT J with weightbearing. -XR: As above no evidence of postoperative complication -WB Status: Weightbearing as tolerated in regular shoes at this time -Patient is okay to return to work. -Medications: Okay to discontinue DVT prophylaxis -Will continue to follow-up with patient in another 6 weeks to ensure that he is gone back to walking in regular shoes without any issue and there is no late complication.  Return in about 6 weeks (around 05/10/2022) for 4th POV L foot lapidus/akin (12 wks).         Everitt Amber, DPM [facility]

## 2022-03-29 NOTE — Telephone Encounter (Signed)
Pt called and is needing a note to return to work without restrictions. He would like it emailed to his work and himself. Work Leisure centre manager is [email or web address] His email is [email or web address]

## 2022-03-30 ENCOUNTER — Ambulatory Visit: Payer: Federal, State, Local not specified - PPO | Admitting: Urology

## 2022-03-30 ENCOUNTER — Encounter: Payer: Self-pay | Admitting: Urology

## 2022-03-30 VITALS — BP 142/95 | HR 73 | Ht 73.0 in | Wt 313.0 lb

## 2022-03-30 DIAGNOSIS — Z3009 Encounter for other general counseling and advice on contraception: Secondary | ICD-10-CM | POA: Diagnosis not present

## 2022-03-30 NOTE — Progress Notes (Signed)
03/30/2022 9:22 AM   Nicholas Patterson 1970/11/19 [id number]  Referring provider: Steele Sizer, MD [address] [place],  [place] [address]  Chief Complaint  Patient presents with   New Patient (Initial Visit)   VAS Consult    HPI: 52 y.o. year old male referred for further evaluation of possible vasectomy.  He denies a history of testicular trauma or pain.  No urinary issues.  No previous scrotal surgeries.  He has 3 adult children.  His wife is 75 but is still concerned that she might be able to become pregnant and would like to have her IUD removed.   PMH: Past Medical History:  Diagnosis Date   Blood clot in vein    left leg   DDD (degenerative disc disease), cervical 01/20/2018   OSA (obstructive sleep apnea) 05/05/2018    Surgical History: Past Surgical History:  Procedure Laterality Date   COLONOSCOPY     HEMORRHOID SURGERY     HERNIA REPAIR      Home Medications:  Allergies as of 03/30/2022       Reactions   Codeine Nausea And Vomiting   Oxycodone Nausea And Vomiting        Medication List    as of March 30, 2022  9:22 AM   You have not been prescribed any medications.     Allergies:  Allergies  Allergen Reactions   Codeine Nausea And Vomiting   Oxycodone Nausea And Vomiting    Family History: Family History  Problem Relation Age of Onset   COPD Mother    Cancer Father        Throat   Prostate cancer Neg Hx    Kidney disease Neg Hx     Social History:  reports that he has quit smoking. His smoking use included cigarettes. He has been exposed to tobacco smoke. He has never used smokeless tobacco. He reports current alcohol use. He reports that he does not use drugs.   Physical Exam: BP (!) 142/95   Pulse 73   Ht 6' 1"$  (1.854 m)   Wt (!) 313 lb (142 kg)   BMI 41.30 kg/m   Constitutional:  Alert and oriented, No acute distress. HEENT: [place] AT, moist mucus membranes.  Trachea midline, no  masses. Cardiovascular: No clubbing, cyanosis, or edema. Respiratory: Normal respiratory effort, no increased work of breathing. GI: Abdomen is soft, nontender, nondistended, no abdominal masses GU: Normal phallus.  Bilateral descended testicles without masses.  Vasa easily palpable bilaterally. Skin: No rashes, bruises or suspicious lesions. Neurologic: Grossly intact, no focal deficits, moving all 4 extremities. Psychiatric: Normal mood and affect.   Assessment & Plan:    1. Vasectomy evaluation Today, we discussed what the vas deferens is, where it is located, and its function. We reviewed the procedure for vasectomy, it's risks, benefits, alternatives, and likelihood of achieving his goals. We discussed in detail the procedure, complications, and recovery as well as the need for clearance prior to unprotected intercourse. We discussed that vasectomy does not protect against sexually transmitted diseases. We discussed that this procedure does not result in immediate sterility and that they would need to use other forms of birth control until he has been cleared with negative postvasectomy semen analyses. I explained that the procedure is considered to be permanent and that attempts at reversal have varying degrees of success. These options include vasectomy reversal, sperm retrieval, and in vitro fertilization; these can be very expensive. We discussed the chance of  postvasectomy pain syndrome which occurs in less than 5% of patients. I explained to the patient that there is no treatment to resolve this chronic pain, and that if it developed I would not be able to help resolve the issue, but that surgery is generally not needed for correction. I explained there have even been reports of systemic like illness associated with this chronic pain, and that there was no good cure. I explained that vasectomy it is not a 100% reliable form of birth control, and the risk of pregnancy after vasectomy is  approximately 1 in 2000 men who had a negative postvasectomy semen analysis or rare non-motile sperm. I explained that repeat vasectomy was necessary in less than 1% of vasectomy procedures when employing the type of technique that I use. I explained that he should refrain from ejaculation for approximately one week following vasectomy. I explained that there are other options for birth control which are permanent and non-permanent; we discussed these. I explained the rates of surgical complications, such as symptomatic hematoma or infection, are low (1-2%) and vary with the surgeon's experience and criteria used to diagnose the complication.   The patient had the opportunity to ask questions to his stated satisfaction. He voiced understanding of the above factors and stated that he has read all the information provided to him and the packets and informed consent.  We did have a frank and honest discussion today about his wife's age and her ability to become pregnant.  Out of utmost precaution, she would like to proceed.   Hollice Espy, MD  [facility] [address] [place], [place] [address] [phone]

## 2022-04-29 ENCOUNTER — Emergency Department
Admission: EM | Admit: 2022-04-29 | Discharge: 2022-04-29 | Disposition: A | Discharge status: Home / Self Care | Payer: Self-pay | Attending: Emergency Medicine | Admitting: Emergency Medicine

## 2022-04-29 ENCOUNTER — Other Ambulatory Visit: Payer: Self-pay

## 2022-04-29 ENCOUNTER — Emergency Department: Payer: Self-pay

## 2022-04-29 DIAGNOSIS — L662 Folliculitis decalvans: Secondary | ICD-10-CM | POA: Insufficient documentation

## 2022-04-29 DIAGNOSIS — Z23 Encounter for immunization: Secondary | ICD-10-CM | POA: Insufficient documentation

## 2022-04-29 DIAGNOSIS — S01111A Laceration without foreign body of right eyelid and periocular area, initial encounter: Secondary | ICD-10-CM | POA: Insufficient documentation

## 2022-04-29 DIAGNOSIS — S0181XA Laceration without foreign body of other part of head, initial encounter: Secondary | ICD-10-CM

## 2022-04-29 DIAGNOSIS — S0083XA Contusion of other part of head, initial encounter: Secondary | ICD-10-CM

## 2022-04-29 DIAGNOSIS — Y9384 Activity, sleeping: Secondary | ICD-10-CM | POA: Insufficient documentation

## 2022-04-29 DIAGNOSIS — L739 Follicular disorder, unspecified: Secondary | ICD-10-CM

## 2022-04-29 DIAGNOSIS — R519 Headache, unspecified: Secondary | ICD-10-CM | POA: Insufficient documentation

## 2022-04-29 MED ORDER — LIDOCAINE-EPINEPHRINE (PF) 1 %-1:200000 IJ SOLN
10.0000 mL | Freq: Once | INTRAMUSCULAR | Status: AC
  Administered 2022-04-29: 10 mL
  Filled 2022-04-29: qty 10

## 2022-04-29 MED ORDER — MUPIROCIN 2 % EX OINT: 1.0000 | TOPICAL_OINTMENT | Freq: Three times a day (TID) | CUTANEOUS | 0 refills | Status: AC

## 2022-04-29 MED ORDER — TETANUS-DIPHTH-ACELL PERTUSSIS 5-2.5-18.5 LF-MCG/0.5 IM SUSY
0.5000 mL | PREFILLED_SYRINGE | Freq: Once | INTRAMUSCULAR | Status: AC
  Administered 2022-04-29: 0.5 mL via INTRAMUSCULAR
  Filled 2022-04-29: qty 0.5

## 2022-04-29 NOTE — Discharge Instructions (Addendum)
Call Monday to make an appoint with Dr. Yvone Neu who is the specialist on-call for [facility] for further evaluation of your mastoid effusion that was seen on CT scan. Keep your laceration clean and dry.  Clean daily with mild soap and water and allowed to dry completely.  Sutures should be removed in approximately 5 days and can be done at urgent care.  For your infected hair follicle you will need to put warm moist compresses to the area frequently and may apply Bactroban ointment 3 times a day.
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
## 2022-05-10 ENCOUNTER — Encounter: Payer: Self-pay | Admitting: Podiatry

## 2022-05-10 ENCOUNTER — Other Ambulatory Visit: Payer: Self-pay | Admitting: Podiatry

## 2022-05-10 ENCOUNTER — Ambulatory Visit (INDEPENDENT_AMBULATORY_CARE_PROVIDER_SITE_OTHER): Payer: Federal, State, Local not specified - PPO

## 2022-05-10 ENCOUNTER — Ambulatory Visit (INDEPENDENT_AMBULATORY_CARE_PROVIDER_SITE_OTHER): Payer: Federal, State, Local not specified - PPO | Admitting: Podiatry

## 2022-05-10 VITALS — BP 149/94 | HR 89

## 2022-05-10 DIAGNOSIS — M2012 Hallux valgus (acquired), left foot: Secondary | ICD-10-CM

## 2022-05-10 DIAGNOSIS — Z9889 Other specified postprocedural states: Secondary | ICD-10-CM

## 2022-05-10 DIAGNOSIS — M21612 Bunion of left foot: Secondary | ICD-10-CM

## 2022-05-10 NOTE — Progress Notes (Signed)
Subjective:  Patient ID: Nicholas Patterson, male    DOB: February 26, 1970,  MRN: [id number]  Chief Complaint  Patient presents with   Routine Post Op    DOS 02/14/22: Lapidus Procedure including Bunionectomy, Double Osteotomy Left    "Its a little swollen and tender"    DOS: 02/14/2022 Procedure: Left foot Lapidus bunionectomy and Akin osteotomy  52 y.o. male returns for fourth post-op check now approximately 12 weeks postop.  status post left foot Lapidus and Aiken.  He has been weightbearing in regular shoes since last appointment patient states he has been doing well since last appointment he does note some occasional aching pain around the site of the fusion however no pain at the medial eminence of the left foot at this time.  Says the swelling is the decreased.  He is back to work now without any trouble.  He is hoping to eventually have the right foot done at some point in time.  Review of Systems: Negative except as noted in the HPI. Denies N/V/F/Ch.   Objective:   Vitals:   05/10/22 0909  BP: (!) 149/94  Pulse: 89    There is no height or weight on file to calculate BMI. Constitutional Well developed. Well nourished.  Vascular Foot warm and well perfused. Capillary refill normal to all digits.  Calf is soft and supple, no posterior calf or knee pain, negative Homans' sign  Neurologic Normal speech. Oriented to person, place, and time. Epicritic sensation to light touch grossly present bilaterally.  Dermatologic Skin well-healed at this time.  Orthopedic: no tenderness to palpation noted about the surgical site at the medial eminence.   Multiple view plain film radiographs: 05/10/2022 XR 3 views AP lateral obique of the left foot.  Attention directed to the first tarsometatarsal joint there is noted to be increased osseous infill across the joint especially lateral oblique views from prior.  Hardware is intact with no evidence of disruption or breakage.  Staples intact in the  proximal phalanx for [place].  Residual hallux valgus deformity is noted.  Ultimately relatively unchanged from prior may be slightly more osseous infill across the arthrodesis site Assessment:   1. Hav (hallux abducto valgus), left   2. Status post left foot surgery   3. Post-operative state   4. Bunion, left foot      Plan:  Patient was evaluated and treated and all questions answered.  S/p foot surgery left foot Lapidus and Akin bunionectomy -Progressing as expected post-operatively.  Still with mild residual valgus of the hallux though this does not bother the patient at all he is very satisfied with the surgical outcome. -He denies any pain in the left foot at the medial eminence, does have mild pain at the first MPJ sometimes with pressure on the area.  Possibly from the plate pressing on the overlying soft tissues.  Less concern for delayed union. -XR: As above no evidence of postoperative complication -WB Status: Weightbearing as tolerated in regular shoes  -Will continue to follow-up with patient in another 6 weeks to ensure that he is fully healed from his Lapidus and Aiken without concern  # Right foot bunion deformity -Patient is interested in pursuing the same surgery on the left on the right foot in the future we will consider this at next appointment  Return in about 6 weeks (around 06/21/2022) for 5th f/u Left foot lapidus.         Everitt Amber, DPM [facility] /  [facility]

## 2022-06-21 ENCOUNTER — Ambulatory Visit: Payer: Federal, State, Local not specified - PPO | Admitting: Podiatry

## 2022-06-21 DIAGNOSIS — Z9889 Other specified postprocedural states: Secondary | ICD-10-CM | POA: Diagnosis not present

## 2022-06-21 DIAGNOSIS — M21612 Bunion of left foot: Secondary | ICD-10-CM

## 2022-06-21 DIAGNOSIS — M2011 Hallux valgus (acquired), right foot: Secondary | ICD-10-CM

## 2022-06-21 DIAGNOSIS — M21611 Bunion of right foot: Secondary | ICD-10-CM | POA: Diagnosis not present

## 2022-06-21 NOTE — Progress Notes (Signed)
Subjective:  Patient ID: Nicholas Patterson, male    DOB: Jul 02, 1970,  MRN: [id number]  Chief Complaint  Patient presents with   Routine Post Op    Patient states that he has been doing ok. Hurts sometimes when walking.    DOS: 02/14/2022 Procedure: Left foot Lapidus bunionectomy and Akin osteotomy  52 y.o. male returns for fourth post-op check now approximately 16 weeks postop.  status post left foot Lapidus and Aiken.  He has been weightbearing in regular shoes since last appointment patient states he has been doing well since last appointment.  No pain at the medial eminence or fusion site.  Overall the patient says he is doing well.  He does note that the toe occasionally rubs on the top of his shoe otherwise no issues with the foot.  Patient is interested in proceeding with bunionectomy on the right foot as he also has a large bunion there and wants to get the same thing done on the other foot.   Review of Systems: Negative except as noted in the HPI. Denies N/V/F/Ch.   Objective:   There were no vitals filed for this visit.   There is no height or weight on file to calculate BMI. Constitutional Well developed. Well nourished.  Vascular Foot warm and well perfused. Capillary refill normal to all digits.  Calf is soft and supple, no posterior calf or knee pain, negative Homans' sign  Neurologic Normal speech. Oriented to person, place, and time. Epicritic sensation to light touch grossly present bilaterally.  Dermatologic Skin well-healed at this time.  Orthopedic: no tenderness to palpation noted about the surgical site at the medial eminence.  Residual hallux valgus is noted on the left foot in the left great toe does abut the second toe.  On the right foot there is no to be moderate to severe hallux valgus deformity with mild hypermobility of the first ray.  There is also noted to be hypertrophic osseous prominence about the right great toe joint however there is pain-free range  of motion available with dorsiflexion plantarflexion.   Multiple view plain film radiographs: Deferred at this visit will take further x-rays in future appointment Assessment:   1. Bunion, right foot   2. Bunion, left foot   3. Hav (hallux abducto valgus), right   4. Status post left foot surgery       Plan:  Patient was evaluated and treated and all questions answered.  S/p foot surgery left foot Lapidus and Akin bunionectomy -Progressing as expected post-operatively.  Still with mild to moderate residual valgus of the hallux though this does not bother the patient at all he is very satisfied with the surgical outcome. -He denies any pain in the left foot at the medial eminence, does have mild pain at the first MPJ sometimes with pressure on the area.  -Discussed that in the future if this continues to be a problem for him we will consider revision with first MPJ arthrodesis -XR: Deferred at this visit will take future x-rays at subsequent follow-up appointments -WB Status: Weightbearing as tolerated in regular shoes    # Right foot bunion deformity -Patient wishes to proceed with Lapidus bunionectomy on the right foot -I discussed with him I would recommend Lapidus bunionectomy with possible Akin osteotomy as needed.  However I would likely use a different surgical system the Lapiplasty system at this time for correction of his bunion. -Will aim to have a more rectus toe postoperatively then on the left foot. -  Discussed risk benefits alternatives and possible complications of Lapidus bunionectomy as well as the expected postoperative recovery course in detail with the patient.  He would like to proceed.  For surgical consent was obtained -Will begin surgical planning for right foot Lapidus bunionectomy         Corinna Gab, DPM [facility] / [facility] 1 part of

## 2022-07-03 DIAGNOSIS — G4733 Obstructive sleep apnea (adult) (pediatric): Secondary | ICD-10-CM | POA: Diagnosis not present

## 2022-08-02 ENCOUNTER — Ambulatory Visit: Payer: Federal, State, Local not specified - PPO | Admitting: Family Medicine

## 2022-10-24 ENCOUNTER — Telehealth: Payer: Self-pay | Admitting: Podiatry

## 2022-10-24 NOTE — Telephone Encounter (Signed)
DOS-11/21/2022  LAPIDUS PROC. BUNIONECTOMY [id number] Ralene Bathe [id number]  [employer] EFFECTIVE DATE- 02/24/2022  DEDUCTIBLE- $350.00 WITH REMAINING $247.23  OOP- $6,000 WITH REMAINING $ 5,832.94  COINSURANCE- 0%  SPOKE WITH CIERRA H. FROM [employer] AND SHE STATED THAT NO PRIOR AUTH IS REQUIRED FOR CPT CODES 19147 AND [phone].  CALL REF #: Laqueta Carina 10/24/2022

## 2022-11-20 ENCOUNTER — Other Ambulatory Visit: Payer: Self-pay | Admitting: Podiatry

## 2022-11-20 DIAGNOSIS — Z9889 Other specified postprocedural states: Secondary | ICD-10-CM

## 2022-11-20 DIAGNOSIS — M21611 Bunion of right foot: Secondary | ICD-10-CM

## 2022-11-20 MED ORDER — ONDANSETRON HCL 4 MG PO TABS: 4.0000 mg | ORAL_TABLET | Freq: Three times a day (TID) | ORAL | 0 refills | Status: DC | PRN

## 2022-11-20 MED ORDER — CEPHALEXIN 500 MG PO CAPS: 500.0000 mg | ORAL_CAPSULE | Freq: Three times a day (TID) | ORAL | 0 refills | Status: AC

## 2022-11-20 MED ORDER — OXYCODONE-ACETAMINOPHEN 5-325 MG PO TABS: 1.0000 | ORAL_TABLET | ORAL | 0 refills | Status: AC | PRN

## 2022-11-20 MED ORDER — IBUPROFEN 800 MG PO TABS: 800.0000 mg | ORAL_TABLET | Freq: Three times a day (TID) | ORAL | 1 refills | Status: AC | PRN

## 2022-11-20 NOTE — Progress Notes (Signed)
Post-op meds sent

## 2022-11-21 ENCOUNTER — Encounter: Payer: Self-pay | Admitting: Podiatry

## 2022-11-21 DIAGNOSIS — G4730 Sleep apnea, unspecified: Secondary | ICD-10-CM | POA: Diagnosis not present

## 2022-11-21 DIAGNOSIS — G8918 Other acute postprocedural pain: Secondary | ICD-10-CM | POA: Diagnosis not present

## 2022-11-21 DIAGNOSIS — M2011 Hallux valgus (acquired), right foot: Secondary | ICD-10-CM

## 2022-11-21 MED ORDER — APIXABAN 2.5 MG PO TABS: 2.5000 mg | ORAL_TABLET | Freq: Two times a day (BID) | ORAL | 0 refills | Status: DC

## 2022-11-29 ENCOUNTER — Encounter: Payer: Self-pay | Admitting: Podiatry

## 2022-11-29 ENCOUNTER — Ambulatory Visit (INDEPENDENT_AMBULATORY_CARE_PROVIDER_SITE_OTHER): Payer: Federal, State, Local not specified - PPO | Admitting: Podiatry

## 2022-11-29 ENCOUNTER — Ambulatory Visit: Payer: Federal, State, Local not specified - PPO

## 2022-11-29 DIAGNOSIS — M21611 Bunion of right foot: Secondary | ICD-10-CM

## 2022-11-29 DIAGNOSIS — Z9889 Other specified postprocedural states: Secondary | ICD-10-CM

## 2022-11-29 NOTE — Progress Notes (Signed)
  Subjective:  Patient ID: Nicholas Patterson, male    DOB: [birth year],  MRN: [id number]  Chief Complaint  Patient presents with   Routine Post Op    Right lapidus w/akin 10/9. Doing well    DOS: 11/21/2022 Procedure: Lapidus bunionectomy, akin osteotomy Right foot  52 y.o. male returns for post-op check.  Patient returns for first postop check approximately 1 week status post right foot Lapidus bunionectomy with Akin osteotomy.  Has been nonweightbearing in cam boot.  Kept dressing clean dry and intact as instructed.  Review of Systems: Negative except as noted in the HPI. Denies N/V/F/Ch.   Objective:  There were no vitals filed for this visit. There is no height or weight on file to calculate BMI. Constitutional Well developed. Well nourished.  Vascular Foot warm and well perfused. Capillary refill normal to all digits.  Calf is soft and supple, no posterior calf or knee pain, negative Homans' sign  Neurologic Normal speech. Oriented to person, place, and time. Epicritic sensation to light touch grossly present bilaterally.  Dermatologic Skin healing well without signs of infection. Skin edges well coapted without signs of infection.  Orthopedic: Tenderness to palpation noted about the surgical site.   Multiple view plain film radiographs: 11/29/2022 XR 3 views AP lateral oblique of the right foot nonweightbearing.  Findings: Assessment:   1. Post-operative state   2. Bunion, right foot    Plan:  Patient was evaluated and treated and all questions answered.  S/p foot surgery right foot Lapidus bunionectomy and Akin osteotomy -Progressing as expected post-operatively. -XR: As above no acute postop complication -WB Status: Nonweightbearing in cam boot -Sutures: Remain intact until next visit. -Medications: Percocet and ibuprofen as needed for pain -Foot redressed.  Return in about 1 week (around 12/06/2022) for 2nd POV no xr.         Corinna Gab, DPM [facility] / [facility]

## 2022-12-07 ENCOUNTER — Encounter: Payer: Federal, State, Local not specified - PPO | Admitting: Podiatry

## 2022-12-20 ENCOUNTER — Encounter: Payer: Self-pay | Admitting: Podiatry

## 2022-12-20 ENCOUNTER — Ambulatory Visit (INDEPENDENT_AMBULATORY_CARE_PROVIDER_SITE_OTHER): Payer: Federal, State, Local not specified - PPO | Admitting: Podiatry

## 2022-12-20 VITALS — Ht 73.0 in | Wt 313.0 lb

## 2022-12-20 DIAGNOSIS — Z9889 Other specified postprocedural states: Secondary | ICD-10-CM

## 2022-12-20 DIAGNOSIS — M21611 Bunion of right foot: Secondary | ICD-10-CM

## 2022-12-20 NOTE — Progress Notes (Signed)
  Subjective:  Patient ID: Nicholas Patterson, male    DOB: 1970/03/24,  MRN: [id number]  Chief Complaint  Patient presents with   Routine Post Op    RM15: 2nd POV no xr. Foot looks good patient states feels good too.    DOS: 11/21/2022 Procedure: Lapidus bunionectomy, akin osteotomy Right foot  52 y.o. male returns for post-op check.  Patient returns for postop check approximately 4 week status post right foot Lapidus bunionectomy with Akin osteotomy.  Has been weightbearing in cam boot.   He is doing very well denies much of any pain has some soreness when he is walking in the boot but not significant.  Hoping to go back to work able to wear boot at work.  Review of Systems: Negative except as noted in the HPI. Denies N/V/F/Ch.   Objective:  There were no vitals filed for this visit. Body mass index is 41.3 kg/m. Constitutional Well developed. Well nourished.  Vascular Foot warm and well perfused. Capillary refill normal to all digits.  Calf is soft and supple, no posterior calf or knee pain, negative Homans' sign  Neurologic Normal speech. Oriented to person, place, and time. Epicritic sensation to light touch grossly present bilaterally.  Dermatologic Skin healing well without signs of infection. Skin edges well coapted without signs of infection.  Orthopedic: Minimal to no tenderness to palpation noted about the surgical site.   Multiple view plain film radiographs: Deferred at this visit Assessment:   1. Post-operative state   2. Bunion, right foot     Plan:  Patient was evaluated and treated and all questions answered.  S/p foot surgery right foot Lapidus bunionectomy and Akin osteotomy -Progressing as expected post-operatively. -Doing very well at this time does not have any significant pain when walking in the cam boot.  Surgical incision healed -XR: Deferred at this visit -WB Status: Weightbearing in cam boot for next month. Okay to return to work but should keep  boot on at work -Medications: As needed ibuprofen -Okay to get foot wet wash with warm soapy water no dressings needed, recommend compression stocking  Return in about 4 weeks (around 01/17/2023) for POV R foot bunion.         Corinna Gab, DPM [facility] / [facility]

## 2023-01-04 DIAGNOSIS — R059 Cough, unspecified: Secondary | ICD-10-CM | POA: Diagnosis not present

## 2023-01-04 DIAGNOSIS — J0100 Acute maxillary sinusitis, unspecified: Secondary | ICD-10-CM | POA: Diagnosis not present

## 2023-01-16 DIAGNOSIS — G4733 Obstructive sleep apnea (adult) (pediatric): Secondary | ICD-10-CM | POA: Diagnosis not present

## 2023-01-17 ENCOUNTER — Ambulatory Visit (INDEPENDENT_AMBULATORY_CARE_PROVIDER_SITE_OTHER): Payer: Federal, State, Local not specified - PPO

## 2023-01-17 ENCOUNTER — Ambulatory Visit: Payer: Federal, State, Local not specified - PPO

## 2023-01-17 ENCOUNTER — Ambulatory Visit (INDEPENDENT_AMBULATORY_CARE_PROVIDER_SITE_OTHER): Payer: Federal, State, Local not specified - PPO | Admitting: Podiatry

## 2023-01-17 ENCOUNTER — Encounter: Payer: Federal, State, Local not specified - PPO | Admitting: Podiatry

## 2023-01-17 DIAGNOSIS — Z9889 Other specified postprocedural states: Secondary | ICD-10-CM

## 2023-01-17 DIAGNOSIS — M21611 Bunion of right foot: Secondary | ICD-10-CM

## 2023-01-17 NOTE — Progress Notes (Signed)
  Subjective:  Patient ID: Nicholas Patterson, male    DOB: 03/12/1970,  MRN: [id number]  Chief Complaint  Patient presents with   Routine Post Op    Denies any pain today but he stomped his right great toe Thanksgiving Day    DOS: 11/21/2022 Procedure: Lapidus bunionectomy, akin osteotomy Right foot  52 y.o. male returns for post-op check.  Patient returns for postop check approximately 8 week status post right foot Lapidus bunionectomy with Akin osteotomy.  Has been weightbearing in cam boot.   He is doing very well denies much if any pain. Wants to get out of CAM boot.   Review of Systems: Negative except as noted in the HPI. Denies N/V/F/Ch.   Objective:  There were no vitals filed for this visit. There is no height or weight on file to calculate BMI. Constitutional Well developed. Well nourished.  Vascular Foot warm and well perfused. Capillary refill normal to all digits.  Calf is soft and supple, no posterior calf or knee pain, negative Homans' sign  Neurologic Normal speech. Oriented to person, place, and time. Epicritic sensation to light touch grossly present bilaterally.  Dermatologic Skin incisions well-healed  Orthopedic: No tenderness to palpation noted about the surgical site.   Multiple view plain film radiographs: 01/17/2023 XR 3 views AP lateral oblique of the right foot.  Findings: Weightbearing attention directed to the first tarsometatarsal joint there is incomplete osseous infill across the joint.  Surgical hardware is intact without evidence of failure however there is minimal osseous bridging across the joint.  There is some evidence of osseous healing however delayed for this point in healing.  No fracture identified.  Decreased first IM angle however slightly increased from immediate postop.  Akin osteotomy Assessment:   1. Post-operative state     Plan:  Patient was evaluated and treated and all questions answered.  S/p foot surgery right foot Lapidus  bunionectomy and Akin osteotomy -Progressing as expected post-operatively. -Doing well in regards to pain not having any pain at the surgical site at this time -XR: As above concern for incomplete healing at the arthrodesis site first TMT J, will treat conservatively at this time. -WB Status: Begin transitional transition back to weightbearing in regular shoe gear if having pain go back to cam boot for 3 weeks -Medications: As needed ibuprofen.  Recommend supplementation with vitamin D and calcium -No dressing or wound care needed fully healed incisions -Overall some concern for delayed union of the first TMT J.  Will progress weightbearing and see how patient tolerates this.  If concern for lingering pain at next appointment we will have to consider further imaging possible revision  Return in about 6 weeks (around 02/28/2023) for POV #5  R foot lapidus akin.         Corinna Gab, DPM [facility] / [facility]

## 2023-01-18 ENCOUNTER — Encounter: Payer: Federal, State, Local not specified - PPO | Admitting: Podiatry

## 2023-02-16 DIAGNOSIS — G4733 Obstructive sleep apnea (adult) (pediatric): Secondary | ICD-10-CM | POA: Diagnosis not present

## 2023-02-28 ENCOUNTER — Ambulatory Visit: Payer: Federal, State, Local not specified - PPO | Admitting: Podiatry

## 2023-03-14 ENCOUNTER — Ambulatory Visit: Payer: Federal, State, Local not specified - PPO | Admitting: Podiatry

## 2023-03-14 ENCOUNTER — Ambulatory Visit (INDEPENDENT_AMBULATORY_CARE_PROVIDER_SITE_OTHER): Payer: Federal, State, Local not specified - PPO

## 2023-03-14 ENCOUNTER — Encounter: Payer: Self-pay | Admitting: Podiatry

## 2023-03-14 DIAGNOSIS — M21612 Bunion of left foot: Secondary | ICD-10-CM

## 2023-03-14 DIAGNOSIS — Z9889 Other specified postprocedural states: Secondary | ICD-10-CM

## 2023-03-14 NOTE — Progress Notes (Signed)
Subjective:   Patient ID: Nicholas Patterson, male   DOB: 53 y.o.   MRN: [id number]   HPI Patient presents after having surgery by Dr. Annamary Rummage for chronic bunion deformity right.  He states overall he is doing pretty well still gets some swelling mild discomfort but is working in normal shoe gear   ROS      Objective:  Physical Exam  Neurovascular status intact negative Homans' sign noted wound edges are coapted well moderate edema in the foot appears to be mild under correction of deformity with deviation of the hallux in a lateral direction but did have severe preoperative deformity with flatfoot deformity also noted and obesity as complicating factor     Assessment:  Clinically appears to be healing okay as he has returned to work full-time and is having only mild pain with mild swelling no erythema noted     Plan:  H&P x-rays right reviewed discussed that there is some nonhealing of the area but clinically he is doing extremely well so we hate to have to consider revisional surgery at this time.  It is possible that this will require a removal of fixation bone graft with 3 fixation but given the fact he is working full-time now and only has mild swelling organ to defer this for another 8 weeks and I hope that healing will occur.  Patient is completely agreeable to this approach and will be seen back  X-rays do indicate lucency of the fusion site right with probable loosening of the medial plate inserted with screw not providing good fixation currently and under correction noted of deformity

## 2023-03-19 DIAGNOSIS — G4733 Obstructive sleep apnea (adult) (pediatric): Secondary | ICD-10-CM | POA: Diagnosis not present

## 2023-05-02 ENCOUNTER — Encounter: Payer: Self-pay | Admitting: Podiatry

## 2023-05-02 ENCOUNTER — Ambulatory Visit (INDEPENDENT_AMBULATORY_CARE_PROVIDER_SITE_OTHER)

## 2023-05-02 ENCOUNTER — Ambulatory Visit: Payer: Federal, State, Local not specified - PPO | Admitting: Podiatry

## 2023-05-02 VITALS — Ht 73.0 in | Wt 313.0 lb

## 2023-05-02 DIAGNOSIS — M960 Pseudarthrosis after fusion or arthrodesis: Secondary | ICD-10-CM

## 2023-05-02 DIAGNOSIS — M21611 Bunion of right foot: Secondary | ICD-10-CM | POA: Diagnosis not present

## 2023-05-02 NOTE — Progress Notes (Signed)
  Subjective:  Patient ID: Nicholas Patterson, male    DOB: 06-03-1970,  MRN: [id number]  Chief Complaint  Patient presents with   Routine Post Op    POV DOS 11/21/22 --- RIGHT FOOT LAPIDUS BUNIONECTOMY, POSSIBLE AIKEN OSTEOTOMY, Pt states his right foot after a long day is very sore and tender, no other complaints.     DOS: 11/21/2022 Procedure: Lapidus bunionectomy, akin osteotomy Right foot  53 y.o. male returns for post-op check.  Patient returns for postop check approximately 5.5 mo status post right foot Lapidus bunionectomy with Akin osteotomy.  Has been weightbearing in regular shoe.  He denies pain all the time says it causes pain in bothers him if he does a lot of walking at the end of the day.  However he says it is not all the time painful for him  Review of Systems: Negative except as noted in the HPI. Denies N/V/F/Ch.   Objective:  There were no vitals filed for this visit. Body mass index is 41.3 kg/m. Constitutional Well developed. Well nourished.  Vascular Foot warm and well perfused. Capillary refill normal to all digits.  Calf is soft and supple, no posterior calf or knee pain, negative Homans' sign  Neurologic Normal speech. Oriented to person, place, and time. Epicritic sensation to light touch grossly present bilaterally.  Dermatologic Skin incisions well-healed  Orthopedic: Minimal to no tenderness to palpation noted about the surgical site.   Multiple view plain film radiographs: 05/02/2023 XR 3 views AP lateral oblique of the right foot.  Findings: Weightbearing attention directed to the first tarsometatarsal joint there is continued to remain nonunion at the first tarsometatarsal joint with absent osseous infill across the arthrodesis site hardware is intact without evidence of breakage.  There is interval increase in the first intermetatarsal angle slight recurrence of the bunion deformity with valgus angulation of the hallux. Assessment:   1. Bunion, right  foot   2. Nonunion after arthrodesis     Plan:  Patient was evaluated and treated and all questions answered.  S/p foot surgery right foot Lapidus bunionectomy and Akin osteotomy, with nonunion of 1st TMTJ -Discussed with patient the evidence of nonunion at the arthrodesis site and options -Discussed option would be for continued monitoring and bone stimulator as an option to avoid revisional surgery however he may need this in the future.  Did discuss that with revision surgery is a bigger surgery with increased time needed for nonweightbearing which she says he cannot due to work circumstances currently.  He feels that he can currently handle any type of discomfort he may have as it is not severe and wishes to continue with conservative therapy at this time -Will try to order bone stimulator if it can be approved may benefit patient developed some osseous infill across the surgical site -Recommend high-dose vitamin D supplementation  -Patient will follow-up in 3 months and we will see how he has been doing with the bone stimulator if there is any worsening of his pain deformity or he wishes to have it corrected he should just call and we will get him into discuss revision surgery options         Corinna Gab, DPM [facility] / [facility]

## 2023-07-01 NOTE — ED Provider Notes (Signed)
 Emergency Department Provider Note   Assessment and Medical Decision Making    53 y.o. male w/ PMH of HTN who presents for a few weeks of difficulty passing urine with intermittent left-sided testicular/groin swelling and pain.  Vitals on presentation are notable for borderline tachycardia to 100 BPM, otherwise within normal limits.  Physical exam performed with nurse chaperone present at bedside. Testicles non-tender and equal in size and lie. Left inguinal hernia extending into the scrotum but reducible with trendelenburg and pressure. White thick discharge from behind the glands of the penis. Verruca of the left scrotum.  Presentation concerning for inguinal hernia.  Low concern for testicular torsion based on exam.  Patient's urinary symptoms would be atypical for inguinal hernia.  Will obtain CT of the abdomen pelvis to workup inguinal hernia and intermittent urinary retention relieved with pressure in the inguinal crease.  Will obtain UA to evaluate for urinary tract infection or hematuria which can cause retention.  Will obtain basic labs including CBC and BMP.  See ED course for continuation of care.  ED Course as of 07/02/23 0001  Mon Jul 01, 2023  1817 Glucose(!): 389 Anion gap normal, bicarbonate normal, low concern for DKA or HHS  1900 Unfortunately, the patient was taken to CT scan and at that time reportedly told the CT tech that he did not want the scan.  He was returned to his room and when I went to discuss his reluctance to imaging he was not in the room.  It appears he is eloped from the department without informing anyone.  Will place referral to general surgery given concern for inguinal hernia.    Independent Interpretation of Studies: CT - see ED course External Records Reviewed: 10/11/2021 [facility] ED Visit - Reviewed patient's medical history  Escalation of Care, Consideration of Admission/Observation/Transfer: Escalation of Care, Consideration of  Admission/Observation/Transfer: Patient eloped prior to completion of workup Prescription drug(s) considered but not prescribed: Topical nystatin for likely candidal balanitis, however, patient eloped prior to completion of workup Diagnostic tests considered but not performed: CT abdomen pelvis: Patient eloped prior to obtaining  ____________________________________________  The case was discussed with the attending physician, who is in agreement with the above assessment and plan.    History   Chief Complaint  Patient presents with   Urinary Retention    HPI: Andrew Zuniga is a pleasant 53 y.o. male w/ PMH of HTN who presents to the ED for evaluation of urinary retention. The patient reports a few weeks of difficulty passing urine with intermittent left-sided testicular/groin swelling and pain. He is also concerned for a possible left inguinal hernia, stating that he has to push the area inwards in order to urinate. His last bowel movement was yesterday and was slightly firmer than normal. He denies fevers, chills, nausea, emesis, hematochezia, or melena.  PMH/PSH: As noted in HPI.  Allergies: No Known Allergies  Social:  Social History   Tobacco Use   Smoking status: Every Day    Current packs/day: 1.00    Types: Cigarettes   Smokeless tobacco: Never  Vaping Use   Vaping status: Never Used  Substance Use Topics   Alcohol use: Yes    Alcohol/week: 42.0 standard drinks of alcohol    Types: 42 Cans of beer per week   Drug use: Yes    Types: Crack cocaine, Marijuana    Physical Exam  BP 165/111   Pulse 100   Temp 36.4 C (97.5 F) (Oral)   Resp 18  Wt (!) 111.6 kg (246 lb)   SpO2 96%   BMI 32.46 kg/m    Physical Exam Vitals and nursing note reviewed. Exam conducted with a chaperone present.  HENT:     Head: Normocephalic and atraumatic.  Eyes:     Conjunctiva/sclera: Conjunctivae normal.  Cardiovascular:     Rate and Rhythm: Normal rate.   Pulmonary:     Effort: Pulmonary effort is normal.  Abdominal:     General: There is no distension.     Tenderness: There is no abdominal tenderness.  Genitourinary:    Comments: Testicles non-tender and equal in size and lie. Left inguinal hernia extending into the scrotum but reducible with trendelenburg and pressure. White thick discharge from behind the glands of the penis. Verruca of the left scrotum. Musculoskeletal:        General: Normal range of motion.  Skin:    General: Skin is warm.     Coloration: Skin is not jaundiced or pale.  Neurological:     General: No focal deficit present.     Mental Status: He is alert.  Psychiatric:        Mood and Affect: Mood normal.      Radiology   No orders to display    Labs   Labs Reviewed  BASIC METABOLIC PANEL - Abnormal; Notable for the following components:      Result Value   Creatinine 0.65 (*)    Glucose 389 (*)    All other components within normal limits  CBC W/ DIFFERENTIAL   Narrative:    The following orders were created for panel order CBC w/ Differential. Procedure                               Abnormality         Status                    ---------                               -----------         ------                    CBC w/ [id number]]                             Final result               Please view results for these tests on the individual orders.  URINALYSIS WITH MICROSCOPY WITH CULTURE REFLEX   Narrative:    The following orders were created for panel order Urinalysis with Microscopy with Culture Reflex (Clean Catch). Procedure                               Abnormality         Status                    ---------                               -----------         ------  Urinalysis with Microsc.SABRASABRA[[id number]]                                                 Please view results for these tests on the individual orders.  CBC W/ AUTO DIFF  URINALYSIS WITH MICROSCOPY  WITH CULTURE REFLEX PERFORMABLE     Documentation assistance was provided by Almarie Meager, Scribe on Jul 01, 2023 at 4:48 PM for Lamar Casey, MD.  Documentation assistance was provided by the scribe in my presence.  The documentation recorded by the scribe has been reviewed by me and accurately reflects the services I personally performed.   Lamar Casey, PGY-3 [facility] Emergency Medicine Pager: [phone]     To patients reading this note: Please be advised the primary purpose of this note is to keep track of your care and communicate with other members of your medical team. Standard sentence structure is not always used. Medical terminology and medical abbreviations may be used. Parts of this note may have been generated using voice dictation software. There may be wrong-word or sound-alike substitution errors and typographical errors missed in proofreading.        Casey Lamar BIRCH, MD Resident 07/02/23 [phone]

## 2023-07-05 ENCOUNTER — Ambulatory Visit (INDEPENDENT_AMBULATORY_CARE_PROVIDER_SITE_OTHER): Payer: Self-pay | Admitting: Family Medicine

## 2023-07-05 ENCOUNTER — Other Ambulatory Visit: Payer: Self-pay

## 2023-07-05 ENCOUNTER — Encounter: Payer: Self-pay | Admitting: Family Medicine

## 2023-07-05 VITALS — BP 136/90 | HR 94 | Resp 16 | Ht 72.0 in | Wt 325.0 lb

## 2023-07-05 DIAGNOSIS — Z131 Encounter for screening for diabetes mellitus: Secondary | ICD-10-CM

## 2023-07-05 DIAGNOSIS — Z0001 Encounter for general adult medical examination with abnormal findings: Secondary | ICD-10-CM

## 2023-07-05 DIAGNOSIS — R5383 Other fatigue: Secondary | ICD-10-CM

## 2023-07-05 DIAGNOSIS — E785 Hyperlipidemia, unspecified: Secondary | ICD-10-CM | POA: Diagnosis not present

## 2023-07-05 DIAGNOSIS — Z0000 Encounter for general adult medical examination without abnormal findings: Secondary | ICD-10-CM

## 2023-07-05 DIAGNOSIS — Z79899 Other long term (current) drug therapy: Secondary | ICD-10-CM | POA: Diagnosis not present

## 2023-07-05 DIAGNOSIS — G4733 Obstructive sleep apnea (adult) (pediatric): Secondary | ICD-10-CM | POA: Diagnosis not present

## 2023-07-05 DIAGNOSIS — Z0110 Encounter for examination of ears and hearing without abnormal findings: Secondary | ICD-10-CM

## 2023-07-05 NOTE — Progress Notes (Addendum)
 Name: Nicholas Patterson   MRN: [id number]    DOB: September 22, 1970   Date:07/05/2023       Progress Note  Subjective  Chief Complaint  Chief Complaint  Patient presents with   Annual Exam    HPI  Patient presents for annual CPE .   IPSS     Row Name 07/05/23 0912         International Prostate Symptom Score   How often have you had the sensation of not emptying your bladder? Not at All     How often have you had to urinate less than every two hours? Not at All     How often have you found you stopped and started again several times when you urinated? Not at All     How often have you found it difficult to postpone urination? Not at All     How often have you had a weak urinary stream? Not at All     How often have you had to strain to start urination? Not at All     How many times did you typically get up at night to urinate? 1 Time     Total IPSS Score 1       Quality of Life due to urinary symptoms   If you were to spend the rest of your life with your urinary condition just the way it is now how would you feel about that? Mostly Satisfied              Diet:cooks at home, likes sweets , sodas and desserts  Exercise: needs to resume regular activity  Last Dental Exam: up to date  Last Eye Exam: up to date   Depression: phq 9 is negative    07/05/2023    9:12 AM 03/27/2022    9:26 AM 10/06/2021    1:59 PM 09/07/2021    2:17 PM 05/05/2018   11:24 AM  Depression screen PHQ 2/9  Decreased Interest 0 0 0 0 0  Down, Depressed, Hopeless 0 0 0 0 0  PHQ - 2 Score 0 0 0 0 0  Altered sleeping 0 0   0  Tired, decreased energy 0 0   0  Change in appetite 0 0   0  Feeling bad or failure about yourself  0 0   0  Trouble concentrating 0 0   0  Moving slowly or fidgety/restless 0 0   0  Suicidal thoughts 0 0   0  PHQ-9 Score 0 0   0  Difficult doing work/chores Not difficult at all    Not difficult at all    Hypertension:  BP Readings from Last 3 Encounters:  07/05/23 (!)  136/90  05/10/22 (!) 149/94  03/30/22 (!) 142/95    Obesity: Wt Readings from Last 3 Encounters:  07/05/23 (!) 325 lb (147.4 kg)  05/02/23 (!) 313 lb (142 kg)  12/20/22 (!) 313 lb (142 kg)   BMI Readings from Last 3 Encounters:  07/05/23 44.08 kg/m  05/02/23 41.30 kg/m  12/20/22 41.30 kg/m     [employer] Visit from 07/05/2023 in [facility]  AUDIT-C Score 2       Hearing Screening   500Hz  1000Hz  2000Hz  4000Hz   Right ear Pass Pass Pass Pass  Left ear Pass Pass Pass Pass     Married STD testing and prevention (HIV/chl/gon/syphilis):  not applicable Sexual history: one partner  Hep C Screening: completed Skin cancer:  Discussed monitoring for atypical lesions Colorectal cancer: up to date  Prostate cancer:  not applicable    Lung cancer:  Low Dose CT Chest recommended if Age 49-80 years, 30 pack-year currently smoking OR have quit w/in 15years. Patient  is not a candidate for screening   AAA: The USPSTF recommends one-time screening with ultrasonography in men ages 70 to 75 years who have ever smoked. Patient   is not a candidate for screening  ECG:  2012  Vaccines: reviewed with the patient. Does not want shingrix   Advanced Care Planning: A voluntary discussion about advance care planning including the explanation and discussion of advance directives.  Discussed health care proxy and Living will, and the patient was able to identify a health care proxy as wife .  Patient does not have a living will and power of attorney of health care   Patient Active Problem List   Diagnosis Date Noted   Morbid obesity (HCC) 07/05/2023   Pre-diabetes 03/28/2022   History of DVT in adulthood 03/27/2022   Arthritis of left shoulder region 10/06/2021   OSA (obstructive sleep apnea) 05/05/2018   DDD (degenerative disc disease), cervical 01/20/2018    Past Surgical History:  Procedure Laterality Date   COLONOSCOPY     HEMORRHOID SURGERY      HERNIA REPAIR      Family History  Problem Relation Age of Onset   COPD Mother    Cancer Father        Throat   Diabetes Son    Prostate cancer Neg Hx    Kidney disease Neg Hx     Social History   Socioeconomic History   Marital status: Married    Spouse name: [name]   Number of children: 3   Years of education: Not on file   Highest education level: 12th grade  Occupational History   Not on file  Tobacco Use   Smoking status: Former    Current packs/day: 1.50    Average packs/day: 1.5 packs/day for 17.4 years (26.1 ttl pk-yrs)    Types: Cigarettes    Start date: 2008    Passive exposure: Past   Smokeless tobacco: Never  Vaping Use   Vaping status: Never Used  Substance and Sexual Activity   Alcohol use: Not Currently    Comment: occasionally   Drug use: No   Sexual activity: Yes    Partners: Female    Birth control/protection: None  Other Topics Concern   Not on file  Social History Narrative   Husband of patient, Rollins Wrightson and son-in-law of Ms. Ane Keener   3 children    Social Drivers of Corporate investment banker Strain: Low Risk  (06/30/2023)   Overall Financial Resource Strain (CARDIA)    Difficulty of Paying Living Expenses: Not hard at all  Food Insecurity: No Food Insecurity (06/30/2023)   Hunger Vital Sign    Worried About Running Out of Food in the Last Year: Never true    Ran Out of Food in the Last Year: Never true  Transportation Needs: No Transportation Needs (06/30/2023)   PRAPARE - Administrator, Civil Service (Medical): No    Lack of Transportation (Non-Medical): No  Physical Activity: Inactive (07/05/2023)   Exercise Vital Sign    Days of Exercise per Week: 0 days    Minutes of Exercise per Session: 0 min  Stress: No Stress Concern Present (06/30/2023)   [employer] of Occupational Health - Occupational Stress  Questionnaire    Feeling of Stress : Not at all  Social Connections: Moderately Integrated (06/30/2023)    Social Connection and Isolation Panel [NHANES]    Frequency of Communication with Friends and Family: More than three times a week    Frequency of Social Gatherings with Friends and Family: Once a week    Attends Religious Services: 1 to 4 times per year    Active Member of [employer] or Organizations: No    Attends Engineer, structural: Not on file    Marital Status: Married  Catering manager Violence: Not At Risk (07/05/2023)   Humiliation, Afraid, Rape, and Kick questionnaire    Fear of Current or Ex-Partner: No    Emotionally Abused: No    Physically Abused: No    Sexually Abused: No     Current Outpatient Medications:    albuterol (VENTOLIN HFA) 108 (90 Base) MCG/ACT inhaler, Inhale 2 puffs into the lungs every 6 (six) hours as needed for wheezing (PRN)., Disp: , Rfl:    ibuprofen  (ADVIL ) 800 MG tablet, Take 1 tablet (800 mg total) by mouth every 8 (eight) hours as needed., Disp: 30 tablet, Rfl: 1  Allergies  Allergen Reactions   Codeine Nausea And Vomiting   Oxycodone  Nausea And Vomiting     ROS  Constitutional: Negative for fever , positive weight change.  Respiratory: Negative for cough and shortness of breath.   Cardiovascular: Negative for chest pain or palpitations.  Gastrointestinal: Negative for abdominal pain, no bowel changes.  Musculoskeletal: Negative for gait problem or joint swelling.  Skin: Negative for rash.  Neurological: Negative for dizziness , noticing frequent headache.  No other specific complaints in a complete review of systems (except as listed in HPI above).    Objective  Vitals:   07/05/23 0916 07/05/23 1024  BP: (!) 138/92 (!) 136/90  Pulse: 94   Resp: 16   SpO2: 97%   Weight: (!) 325 lb (147.4 kg)   Height: 6' (1.829 m)     Body mass index is 44.08 kg/m.  Physical Exam  Constitutional: Patient appears well-developed and well-nourished. No distress.  HENT: Head: Normocephalic and atraumatic. Ears: B TMs ok, no erythema or  effusion; Nose: Nose normal. Mouth/Throat: Oropharynx is clear and moist. No oropharyngeal exudate.  Eyes: Conjunctivae and EOM are normal. Pupils are equal, round, and reactive to light. No scleral icterus.  Neck: Normal range of motion. Neck supple. No JVD present. No thyromegaly present.  Cardiovascular: Normal rate, regular rhythm and normal heart sounds.  No murmur heard. No BLE edema. Pulmonary/Chest: Effort normal and breath sounds normal. No respiratory distress. Abdominal: Soft. Bowel sounds are normal, no distension. There is no tenderness. no masses MALE GENITALIA: Normal descended testes bilaterally,varicocele on right side , no hernias, no lesions, no discharge RECTAL: not done  Musculoskeletal: Normal range of motion, no joint effusions. No gross deformities Neurological: he is alert and oriented to person, place, and time. No cranial nerve deficit. Coordination, balance, strength, speech and gait are normal.  Skin: Skin is warm and dry. No rash noted. No erythema.  Psychiatric: Patient has a normal mood and affect. behavior is normal. Judgment and thought content normal.     Assessment & Plan  1. Well adult exam (Primary)  - CBC with Differential/Platelet - Comprehensive metabolic panel with GFR - Lipid panel - Hemoglobin A1c - TSH - B12 and Folate Panel - VITAMIN D 25 Hydroxy (Vit-D Deficiency, Fractures)  2. Dyslipidemia  - Lipid panel  3. Diabetes mellitus screening  - Hemoglobin A1c  4. Long-term use of high-risk medication  - Comprehensive metabolic panel with GFR  5. Morbid obesity (HCC)  Discussed with the patient the risk posed by an increased BMI. Discussed importance of portion control, calorie counting and at least 150 minutes of physical activity weekly. Avoid sweet beverages and drink more water. Eat at least 6 servings of fruit and vegetables daily    6. OSA on CPAP  - CBC with Differential/Platelet - Ambulatory referral to Sleep Studies -  needs titration study, he has gained weight, feeling more tired, headaches, DBP is elevated. May need to adjust settings   7. Fatigue, unspecified type  - TSH - B12 and Folate Panel - VITAMIN D 25 Hydroxy (Vit-D Deficiency, Fractures)   -Prostate cancer screening and PSA options (with potential risks and benefits of testing vs not testing) were discussed along with recent recs/guidelines. -USPSTF grade A and B recommendations reviewed with patient; age-appropriate recommendations, preventive care, screening tests, etc discussed and encouraged; healthy living encouraged; see AVS for patient education given to patient -Discussed importance of 150 minutes of physical activity weekly, eat two servings of fish weekly, eat one serving of tree nuts ( cashews, pistachios, pecans, almonds.Aaron Aas) every other day, eat 6 servings of fruit/vegetables daily and drink plenty of water and avoid sweet beverages.  -Reviewed Health Maintenance: yes

## 2023-07-06 LAB — COMPREHENSIVE METABOLIC PANEL WITH GFR
AG Ratio: 1.5 (calc) (ref 1.0–2.5)
ALT: 37 U/L (ref 9–46)
AST: 25 U/L (ref 10–35)
Albumin: 4.1 g/dL (ref 3.6–5.1)
Alkaline phosphatase (APISO): 84 U/L (ref 35–144)
BUN: 16 mg/dL (ref 7–25)
CO2: 30 mmol/L (ref 20–32)
Calcium: 9.1 mg/dL (ref 8.6–10.3)
Chloride: 103 mmol/L (ref 98–110)
Creat: 1.02 mg/dL (ref 0.70–1.30)
Globulin: 2.7 g/dL (ref 1.9–3.7)
Glucose, Bld: 95 mg/dL (ref 65–99)
Potassium: 4.3 mmol/L (ref 3.5–5.3)
Sodium: 142 mmol/L (ref 135–146)
Total Bilirubin: 1 mg/dL (ref 0.2–1.2)
Total Protein: 6.8 g/dL (ref 6.1–8.1)
eGFR: 89 mL/min/{1.73_m2} (ref >=60)

## 2023-07-06 LAB — CBC WITH DIFFERENTIAL/PLATELET
Absolute Lymphocytes: 2736 {cells}/uL (ref 850–3900)
Absolute Monocytes: 439 {cells}/uL (ref 200–950)
Basophils Absolute: 43 {cells}/uL (ref 0–200)
Basophils Relative: 0.6 %
Eosinophils Absolute: 288 {cells}/uL (ref 15–500)
Eosinophils Relative: 4 %
HCT: 45.5 % (ref 38.5–50.0)
Hemoglobin: 14.8 g/dL (ref 13.2–17.1)
MCH: 27.4 pg (ref 27.0–33.0)
MCHC: 32.5 g/dL (ref 32.0–36.0)
MCV: 84.3 fL (ref 80.0–100.0)
MPV: 10.1 fL (ref 7.5–12.5)
Monocytes Relative: 6.1 %
Neutro Abs: 3694 {cells}/uL (ref 1500–7800)
Neutrophils Relative %: 51.3 %
Platelets: 268 10*3/uL (ref 140–400)
RBC: 5.4 10*6/uL (ref 4.20–5.80)
RDW: 14.3 % (ref 11.0–15.0)
Total Lymphocyte: 38 %
WBC: 7.2 10*3/uL (ref 3.8–10.8)

## 2023-07-06 LAB — HEMOGLOBIN A1C
Hgb A1c MFr Bld: 6.4 % — ABNORMAL HIGH (ref <5.7)
Mean Plasma Glucose: 137 mg/dL
eAG (mmol/L): 7.6 mmol/L

## 2023-07-06 LAB — LIPID PANEL
Cholesterol: 228 mg/dL — ABNORMAL HIGH (ref <200)
HDL: 52 mg/dL (ref >=40)
LDL Cholesterol (Calc): 151 mg/dL — ABNORMAL HIGH
Non-HDL Cholesterol (Calc): 176 mg/dL — ABNORMAL HIGH (ref <130)
Total CHOL/HDL Ratio: 4.4 (calc) (ref <5.0)
Triglycerides: 126 mg/dL (ref <150)

## 2023-07-06 LAB — TSH: TSH: 1.02 m[IU]/L (ref 0.40–4.50)

## 2023-07-06 LAB — VITAMIN D 25 HYDROXY (VIT D DEFICIENCY, FRACTURES): Vit D, 25-Hydroxy: 22 ng/mL — ABNORMAL LOW (ref 30–100)

## 2023-07-06 LAB — B12 AND FOLATE PANEL
Folate: 7.2 ng/mL
Vitamin B-12: 415 pg/mL (ref 200–1100)

## 2023-07-09 ENCOUNTER — Ambulatory Visit: Payer: Self-pay | Admitting: Family Medicine

## 2023-07-18 ENCOUNTER — Other Ambulatory Visit (HOSPITAL_COMMUNITY): Payer: Self-pay

## 2023-07-18 ENCOUNTER — Encounter: Payer: Self-pay | Admitting: Family Medicine

## 2023-07-18 ENCOUNTER — Ambulatory Visit: Payer: Self-pay | Admitting: Family Medicine

## 2023-07-18 ENCOUNTER — Telehealth: Payer: Self-pay

## 2023-07-18 ENCOUNTER — Ambulatory Visit (INDEPENDENT_AMBULATORY_CARE_PROVIDER_SITE_OTHER): Payer: Self-pay | Admitting: Family Medicine

## 2023-07-18 VITALS — BP 130/84 | HR 99 | Resp 16 | Ht 72.0 in | Wt 323.5 lb

## 2023-07-18 DIAGNOSIS — E559 Vitamin D deficiency, unspecified: Secondary | ICD-10-CM

## 2023-07-18 DIAGNOSIS — R7303 Prediabetes: Secondary | ICD-10-CM | POA: Diagnosis not present

## 2023-07-18 DIAGNOSIS — G4733 Obstructive sleep apnea (adult) (pediatric): Secondary | ICD-10-CM

## 2023-07-18 DIAGNOSIS — E785 Hyperlipidemia, unspecified: Secondary | ICD-10-CM

## 2023-07-18 DIAGNOSIS — Z7984 Long term (current) use of oral hypoglycemic drugs: Secondary | ICD-10-CM

## 2023-07-18 MED ORDER — ZEPBOUND 2.5 MG/0.5ML ~~LOC~~ SOAJ: 2.5000 mg | SUBCUTANEOUS | 0 refills | Status: DC

## 2023-07-18 MED ORDER — VITAMIN D3 50 MCG (2000 UT) PO CAPS: 2000.0000 [IU] | ORAL_CAPSULE | Freq: Every day | ORAL | 0 refills | Status: AC

## 2023-07-18 MED ORDER — METFORMIN HCL ER 500 MG PO TB24: 500.0000 mg | ORAL_TABLET | Freq: Every day | ORAL | 0 refills | Status: DC

## 2023-07-18 NOTE — Progress Notes (Signed)
 Name: Nicholas Patterson   MRN: [id number]    DOB: [birth year]   Date:07/18/2023       Progress Note  Subjective  Chief Complaint  Chief Complaint  Patient presents with   Medical Management of Chronic Issues   Discussed the use of AI scribe software for clinical note transcription with the patient, who gave verbal consent to proceed.  History of Present Illness Nicholas Patterson is a 53 year old male who presents for a regular follow-up and EKG. He is accompanied by his wife and grandson.  He is currently using a CPAP machine for sleep apnea and has an upcoming appointment for a CPAP titration study. He experiences morning headaches and sometimes wakes up feeling tired. Recently, he has started snoring again, which he attributes to the CPAP not sealing properly, possibly due to his beard.  His vitamin D  levels were found to be low during blood work in May, and he is taking 2000 units of vitamin D  daily. His vitamin B12 and folate levels were normal, and his thyroid function was also normal.  His A1c was 6.4%, indicating prediabetes. He has a family history of type 1 diabetes in his son but no personal history of pancreatitis or thyroid cancer. He is morbidly obese with a BMI over 40. He and his wife have started going to the gym a couple of days a week. He wants to cut down on portion sizes.    The 10-year ASCVD risk score (Arnett DK, et al., 2019) is: 5.9%   Values used to calculate the score:     Age: 78 years     Sex: Male     Is Non-Hispanic African American: Yes     Diabetic: No     Tobacco smoker: No     Systolic Blood Pressure: 130 mmHg     Is BP treated: No     HDL Cholesterol: 52 mg/dL     Total Cholesterol: 228 mg/dL    Patient Active Problem List   Diagnosis Date Noted   Morbid obesity (HCC) 07/05/2023   Pre-diabetes 03/28/2022   History of DVT in adulthood 03/27/2022   Arthritis of left shoulder region 10/06/2021   OSA (obstructive sleep apnea) 05/05/2018   DDD  (degenerative disc disease), cervical 01/20/2018    Past Surgical History:  Procedure Laterality Date   COLONOSCOPY     HEMORRHOID SURGERY     HERNIA REPAIR      Family History  Problem Relation Age of Onset   COPD Mother    Cancer Father        Throat   Diabetes Son    Prostate cancer Neg Hx    Kidney disease Neg Hx     Social History   Tobacco Use   Smoking status: Former    Current packs/day: 1.50    Average packs/day: 1.5 packs/day for 17.4 years (26.1 ttl pk-yrs)    Types: Cigarettes    Start date: 2008    Passive exposure: Past   Smokeless tobacco: Never  Substance Use Topics   Alcohol use: Not Currently    Comment: occasionally     Current Outpatient Medications:    albuterol (VENTOLIN HFA) 108 (90 Base) MCG/ACT inhaler, Inhale 2 puffs into the lungs every 6 (six) hours as needed for wheezing (PRN)., Disp: , Rfl:    ibuprofen  (ADVIL ) 800 MG tablet, Take 1 tablet (800 mg total) by mouth every 8 (eight) hours as needed., Disp: 30 tablet, Rfl: 1  Allergies  Allergen Reactions   Codeine Nausea And Vomiting   Oxycodone  Nausea And Vomiting    I personally reviewed active problem list, medication list, allergies with the patient/caregiver today.   ROS  Ten systems reviewed and is negative except as mentioned in HPI    Objective Physical Exam  Constitutional: Patient appears well-developed and well-nourished. Obese  No distress.  HEENT: head atraumatic, normocephalic, pupils equal and reactive to light, neck supple Cardiovascular: Normal rate, regular rhythm and normal heart sounds.  No murmur heard. No BLE edema. Pulmonary/Chest: Effort normal and breath sounds normal. No respiratory distress. Abdominal: Soft.  There is no tenderness. Psychiatric: Patient has a normal mood and affect. behavior is normal. Judgment and thought content normal.   Vitals:   07/18/23 1114 07/18/23 1115  BP: 130/84   Pulse: 100 99  Resp: 16   SpO2: 99%   Weight: (!) 323  lb 8 oz (146.7 kg)   Height: 6' (1.829 m)     Body mass index is 43.87 kg/m.  Recent Results (from the past 2160 hours)  CBC with Differential/Platelet     Status: None   Collection Time: 07/05/23 10:47 AM  Result Value Ref Range   WBC 7.2 3.8 - 10.8 Thousand/uL   RBC 5.40 4.20 - 5.80 Million/uL   Hemoglobin 14.8 13.2 - 17.1 g/dL   HCT 40.9 81.1 - 91.4 %   MCV 84.3 80.0 - 100.0 fL   MCH 27.4 27.0 - 33.0 pg   MCHC 32.5 32.0 - 36.0 g/dL    Comment: For adults, a slight decrease in the calculated MCHC value (in the range of 30 to 32 g/dL) is most likely not clinically significant; however, it should be interpreted with caution in correlation with other red cell parameters and the patient's clinical condition.    RDW 14.3 11.0 - 15.0 %   Platelets 268 140 - 400 Thousand/uL   MPV 10.1 7.5 - 12.5 fL   Neutro Abs 3,694 1,500 - 7,800 cells/uL   Absolute Lymphocytes 2,736 850 - 3,900 cells/uL   Absolute Monocytes 439 200 - 950 cells/uL   Eosinophils Absolute 288 15 - 500 cells/uL   Basophils Absolute 43 0 - 200 cells/uL   Neutrophils Relative % 51.3 %   Total Lymphocyte 38.0 %   Monocytes Relative 6.1 %   Eosinophils Relative 4.0 %   Basophils Relative 0.6 %  Comprehensive metabolic panel with GFR     Status: None   Collection Time: 07/05/23 10:47 AM  Result Value Ref Range   Glucose, Bld 95 65 - 99 mg/dL    Comment: .            Fasting reference interval .    BUN 16 7 - 25 mg/dL   Creat 7.82 9.56 - 2.13 mg/dL   eGFR 89 > OR = 60 YQ/MVH/8.46N6   BUN/Creatinine Ratio SEE NOTE: 6 - 22 (calc)    Comment:    Not Reported: BUN and Creatinine are within    reference range. .    Sodium 142 135 - 146 mmol/L   Potassium 4.3 3.5 - 5.3 mmol/L   Chloride 103 98 - 110 mmol/L   CO2 30 20 - 32 mmol/L   Calcium 9.1 8.6 - 10.3 mg/dL   Total Protein 6.8 6.1 - 8.1 g/dL   Albumin 4.1 3.6 - 5.1 g/dL   Globulin 2.7 1.9 - 3.7 g/dL (calc)   AG Ratio 1.5 1.0 - 2.5 (calc)   Total  Bilirubin 1.0 0.2 - 1.2 mg/dL   Alkaline phosphatase (APISO) 84 35 - 144 U/L   AST 25 10 - 35 U/L   ALT 37 9 - 46 U/L  Lipid panel     Status: Abnormal   Collection Time: 07/05/23 10:47 AM  Result Value Ref Range   Cholesterol 228 (H) <200 mg/dL   HDL 52 > OR = 40 mg/dL   Triglycerides 161 <096 mg/dL   LDL Cholesterol (Calc) 151 (H) mg/dL (calc)    Comment: Reference range: <100 . Desirable range <100 mg/dL for primary prevention;   <70 mg/dL for patients with CHD or diabetic patients  with > or = 2 CHD risk factors. Aaron Aas LDL-C is now calculated using the Martin-Hopkins  calculation, which is a validated novel method providing  better accuracy than the Friedewald equation in the  estimation of LDL-C.  Melinda Sprawls et al. Erroll Heard. 0454;098(11): 2061-2068  (http://education.[facility].com/faq/FAQ164)    Total CHOL/HDL Ratio 4.4 <5.0 (calc)   Non-HDL Cholesterol (Calc) 176 (H) <130 mg/dL (calc)    Comment: For patients with diabetes plus 1 major ASCVD risk  factor, treating to a non-HDL-C goal of <100 mg/dL  (LDL-C of <91 mg/dL) is considered a therapeutic  option.   Hemoglobin A1c     Status: Abnormal   Collection Time: 07/05/23 10:47 AM  Result Value Ref Range   Hgb A1c MFr Bld 6.4 (H) <5.7 %    Comment: For someone without known diabetes, a hemoglobin  A1c value between 5.7% and 6.4% is consistent with prediabetes and should be confirmed with a  follow-up test. . For someone with known diabetes, a value <7% indicates that their diabetes is well controlled. A1c targets should be individualized based on duration of diabetes, age, comorbid conditions, and other considerations. . This assay result is consistent with an increased risk of diabetes. . Currently, no consensus exists regarding use of hemoglobin A1c for diagnosis of diabetes for children. .    Mean Plasma Glucose 137 mg/dL   eAG (mmol/L) 7.6 mmol/L  TSH     Status: None   Collection Time: 07/05/23 10:47 AM   Result Value Ref Range   TSH 1.02 0.40 - 4.50 mIU/L  B12 and Folate Panel     Status: None   Collection Time: 07/05/23 10:47 AM  Result Value Ref Range   Vitamin B-12 415 200 - 1,100 pg/mL   Folate 7.2 ng/mL    Comment:                            Reference Range                            Low:           <3.4                            Borderline:    3.4-5.4                            Normal:        >5.4 .   VITAMIN D  25 Hydroxy (Vit-D Deficiency, Fractures)     Status: Abnormal   Collection Time: 07/05/23 10:47 AM  Result Value Ref Range   Vit D, 25-Hydroxy 22 (L) 30 - 100 ng/mL    Comment: Vitamin  D Status         25-OH Vitamin D : . Deficiency:                    <20 ng/mL Insufficiency:             20 - 29 ng/mL Optimal:                 > or = 30 ng/mL . For 25-OH Vitamin D  testing on patients on  D2-supplementation and patients for whom quantitation  of D2 and D3 fractions is required, the [facility](TM) 25-OH VIT D, (D2,D3), LC/MS/MS is recommended: order  code 16109 (patients >71yrs). . See Note 1 . Note 1 . For additional information, please refer to  http://education.[facility].com/faq/FAQ199  (This link is being provided for informational/ educational purposes only.)     PHQ2/9:    07/18/2023   11:00 AM 07/05/2023    9:12 AM 03/27/2022    9:26 AM 10/06/2021    1:59 PM 09/07/2021    2:17 PM  Depression screen PHQ 2/9  Decreased Interest 0 0 0 0 0  Down, Depressed, Hopeless 0 0 0 0 0  PHQ - 2 Score 0 0 0 0 0  Altered sleeping 0 0 0    Tired, decreased energy 0 0 0    Change in appetite 0 0 0    Feeling bad or failure about yourself  0 0 0    Trouble concentrating 0 0 0    Moving slowly or fidgety/restless 0 0 0    Suicidal thoughts 0 0 0    PHQ-9 Score 0 0 0    Difficult doing work/chores Not difficult at all Not difficult at all       phq 9 is negative  Fall Risk:    07/18/2023   10:59 AM 07/05/2023    9:05 AM 03/27/2022    9:26 AM 10/06/2021     1:59 PM 09/07/2021    2:16 PM  Fall Risk   Falls in the past year? 0 0 0 0 0  Number falls in past yr: 0 0 0 0 0  Injury with Fall? 0 0 0 0 0  Risk for fall due to : No Fall Risks No Fall Risks No Fall Risks No Fall Risks   Follow up Falls prevention discussed;Education provided;Falls evaluation completed Falls prevention discussed;Education provided;Falls evaluation completed Falls prevention discussed Falls prevention discussed Falls evaluation completed     Assessment & Plan Prediabetes A1c at 6.4% indicates prediabetes. Morbid obesity contributes to this condition. Zepbound considered for weight loss, potentially aiding prediabetes, sleep apnea, and cholesterol management. Metformin prescribed to manage blood sugar and support Zepbound approval. - Prescribe metformin. - Initiate Zepbound pending insurance approval. - Encourage dietary modifications and reduced carbohydrate intake. - Advise 30 minutes of daily exercise, especially post-meals.  Obesity Morbid obesity with BMI over 40. Weight loss essential for health improvement, including prediabetes and sleep apnea management. Zepbound proposed for weight loss, targeting 5% body weight reduction in three months. Therapist, occupational pending. - Start Zepbound pending insurance approval. - Encourage dietary modifications and portion control. - Advise 30 minutes of daily exercise. - Target 5% weight loss in three months, approximately 16 pounds.  Sleep Apnea Uses CPAP machine. Reports snoring and morning headaches, indicating possible mask seal issues due to beard. Sleep study scheduled for CPAP titration. - Continue scheduled sleep study for CPAP titration. - Ensure proper CPAP mask fit and seal.  Hyperlipidemia Elevated cholesterol  with LDL at 151. Heart attack and stroke risk at 5.9%. Plan to address through weight loss and dietary changes before medication consideration. Zepbound may aid cholesterol improvement via weight  loss. - Focus on weight loss and dietary modifications. - Reassess medication need post-weight loss evaluation. - EKG normal sinus rhythm  Vitamin D  Deficiency Managed with 2000 units of vitamin D  daily. Advised cost-effective purchase from [facility]. - Continue vitamin D  supplementation at 2000 units daily. - Recommend purchasing supplements from [facility].

## 2023-07-18 NOTE — Telephone Encounter (Signed)
 Pharmacy Patient Advocate Encounter   Received notification from Onbase that prior authorization for Zepbound 2.5MG /0.5ML pen-injectors  is required/requested.   Insurance verification completed.   The patient is insured through [facility] .   Per test claim: PA required; PA submitted to above mentioned insurance via CoverMyMeds Key/confirmation #/EOC [id number] Status is pending

## 2023-07-19 ENCOUNTER — Other Ambulatory Visit (HOSPITAL_COMMUNITY): Payer: Self-pay

## 2023-07-19 NOTE — Telephone Encounter (Signed)
 Pharmacy Patient Advocate Encounter  Received notification from [facility] that Prior Authorization for Zepbound 2.5MG /0.5ML pen-injectors has been DENIED.  Full denial letter will be uploaded to the media tab. See denial reason below.      PA #/Case ID/Reference #: [id number]

## 2023-07-24 NOTE — ED Provider Notes (Signed)
 LWBS/AMA Patient Follow-Up Call I attempted to call patient at [phone] due to recent AMA from the ED. Patient did not answer at listed phone number at this time. Generic voicemail left with call back number.

## 2023-07-31 ENCOUNTER — Ambulatory Visit: Attending: Sleep Medicine

## 2023-07-31 DIAGNOSIS — R5383 Other fatigue: Secondary | ICD-10-CM | POA: Diagnosis not present

## 2023-07-31 DIAGNOSIS — F1920 Other psychoactive substance dependence, uncomplicated: Secondary | ICD-10-CM | POA: Diagnosis not present

## 2023-07-31 DIAGNOSIS — G4733 Obstructive sleep apnea (adult) (pediatric): Secondary | ICD-10-CM | POA: Insufficient documentation

## 2023-08-08 ENCOUNTER — Ambulatory Visit: Admitting: Podiatry

## 2023-08-08 ENCOUNTER — Ambulatory Visit (INDEPENDENT_AMBULATORY_CARE_PROVIDER_SITE_OTHER)

## 2023-08-08 DIAGNOSIS — Z9889 Other specified postprocedural states: Secondary | ICD-10-CM

## 2023-08-08 DIAGNOSIS — M960 Pseudarthrosis after fusion or arthrodesis: Secondary | ICD-10-CM

## 2023-08-08 DIAGNOSIS — M21611 Bunion of right foot: Secondary | ICD-10-CM

## 2023-08-08 MED ORDER — VITAMIN D (ERGOCALCIFEROL) 1.25 MG (50000 UNIT) PO CAPS: 50000.0000 [IU] | ORAL_CAPSULE | ORAL | 0 refills | Status: DC

## 2023-08-08 NOTE — Progress Notes (Signed)
 Subjective:  Patient ID: Nicholas Patterson, male    DOB: 06-09-1970,  MRN: [id number]  Chief Complaint  Patient presents with   Post-op Follow-up    Right foot not healing. Questions about bone stimulator.     DOS: 11/21/2022 Procedure: Lapidus bunionectomy, akin osteotomy Right foot  53 y.o. male returns for post-op check.  Patient returns for postop check approximately 8.5 mo status post right foot Lapidus bunionectomy with Akin osteotomy.  Has been weightbearing in regular shoe.   Have been following him for nonunion at the first TMT J.  He reports he is still having some aching pain especially after being at work and on his feet all day.  He says it is not so much the pain is a soreness but does not bother him and affects how he walks.  He thinks it may be contributing to some secondary ankle pain as a result of the way he is walking.  Review of Systems: Negative except as noted in the HPI. Denies N/V/F/Ch.   Objective:  There were no vitals filed for this visit. There is no height or weight on file to calculate BMI. Constitutional Well developed. Well nourished.  Vascular Foot warm and well perfused. Capillary refill normal to all digits.  Calf is soft and supple, no posterior calf or knee pain, negative Homans' sign  Neurologic Normal speech. Oriented to person, place, and time. Epicritic sensation to light touch grossly present bilaterally.  Dermatologic Skin incisions well-healed  Orthopedic: Minimal   tenderness to palpation noted about the surgical site.   Multiple view plain film radiographs: 08/08/2023 XR 3 views AP lateral oblique of the right foot.  Findings: Weightbearing attention directed to the first tarsometatarsal joint there is evidence of some osseous bridging across the first TMT J slightly improved from prior radiographs with increased radio opacity noted about the fusion site though still with a significant portion of the lateral aspect of the joint that does  appear to have a nonunion..  There is  stable increase in the first intermetatarsal angle slight recurrence of the bunion deformity with valgus angulation of the hallux. Assessment:   1. Bunion of right foot   2. Nonunion after arthrodesis   3. Post-operative state     Plan:  Patient was evaluated and treated and all questions answered.  S/p foot surgery right foot Lapidus bunionectomy and Akin osteotomy, with nonunion of 1st TMTJ -Discussed with patient the evidence of nonunion at the arthrodesis site and options including continued monitoring versus revision surgery which would include removal of current hardware and bone grafting at the arthrodesis site possibly with concentrated  BMA injection, possible first MPJ fusion for residual hallux valgus deformity.  Discussed the risk benefits and alternatives to surgery as well as the expected recovery time would need at least 6 weeks nonweightbearing. - Patient wishes to have more time to consider his options -Recommend high-dose vitamin D  supplementation sent Rx for 50,000 IU once weekly for the next 4 to 5 weeks -Patient will have his vitamin D  level rechecked in about 3 to 4 weeks prior to her appointment to ensure that it has improved last check was vitamin D3 level: 22 -Patient will follow-up in 1 month to discuss vitamin D  recheck as well as get his thoughts on revision versus conservative care.  I am leaning towards revising him and encouraged him that if he has any limitation due to pain I believe it would be worth revision given the evidence of nonunion  at the first TMT J.  Would consider also first MPJ arthrodesis if the hallux does not reduce upon revisional at first TMT J.         Marolyn JULIANNA Honour, DPM [facility] / [facility]

## 2023-08-12 DIAGNOSIS — Q796 Ehlers-Danlos syndrome, unspecified: Secondary | ICD-10-CM

## 2023-08-20 ENCOUNTER — Other Ambulatory Visit: Payer: Self-pay

## 2023-08-20 DIAGNOSIS — G4733 Obstructive sleep apnea (adult) (pediatric): Secondary | ICD-10-CM

## 2023-09-05 ENCOUNTER — Ambulatory Visit: Admitting: Podiatry

## 2023-09-09 ENCOUNTER — Other Ambulatory Visit: Payer: Self-pay | Admitting: Podiatry

## 2023-09-12 DIAGNOSIS — G4733 Obstructive sleep apnea (adult) (pediatric): Secondary | ICD-10-CM | POA: Diagnosis not present

## 2023-10-01 ENCOUNTER — Telehealth: Payer: Self-pay | Admitting: Podiatry

## 2023-10-01 NOTE — Telephone Encounter (Signed)
 DOS 11/21/22 --- RIGHT FOOT LAPIDUS BUNIONECTOMY, POSSIBLE AIKEN OSTEOTOMY   Patient has an appointment scheduled for 8/28, but needs to cancel due to being out of town. He currently lives in [place]  and typically travels to the area on Thursdays and Fridays.  He would like to be seen this Thursday, but understands you are out of office. He is willing to reschedule for 9/11 if that works for you, or is also open to seeing another provider if necessary.  Please advise. Thank you.

## 2023-10-01 NOTE — Telephone Encounter (Signed)
 Patient chose to wait and see you, as it was okay to reschedule. Appointment has been set for October 24, 2023 at 11:15 AM.

## 2023-10-10 ENCOUNTER — Ambulatory Visit: Admitting: Podiatry

## 2023-10-13 DIAGNOSIS — G4733 Obstructive sleep apnea (adult) (pediatric): Secondary | ICD-10-CM | POA: Diagnosis not present

## 2023-10-17 DIAGNOSIS — M21611 Bunion of right foot: Secondary | ICD-10-CM | POA: Diagnosis not present

## 2023-10-17 DIAGNOSIS — E559 Vitamin D deficiency, unspecified: Secondary | ICD-10-CM | POA: Diagnosis not present

## 2023-10-18 LAB — VITAMIN D 25 HYDROXY (VIT D DEFICIENCY, FRACTURES): Vit D, 25-Hydroxy: 27 ng/mL — ABNORMAL LOW (ref 30.0–100.0)

## 2023-10-22 ENCOUNTER — Telehealth (INDEPENDENT_AMBULATORY_CARE_PROVIDER_SITE_OTHER): Payer: Self-pay | Admitting: Sleep Medicine

## 2023-10-22 NOTE — Telephone Encounter (Signed)
 Titration study results forwarded to ordering provider.

## 2023-10-24 ENCOUNTER — Ambulatory Visit: Admitting: Podiatry

## 2023-10-24 ENCOUNTER — Encounter: Payer: Self-pay | Admitting: Podiatry

## 2023-10-24 VITALS — Ht 72.0 in | Wt 323.0 lb

## 2023-10-24 DIAGNOSIS — R7303 Prediabetes: Secondary | ICD-10-CM | POA: Diagnosis not present

## 2023-10-24 DIAGNOSIS — M2011 Hallux valgus (acquired), right foot: Secondary | ICD-10-CM

## 2023-10-24 DIAGNOSIS — E559 Vitamin D deficiency, unspecified: Secondary | ICD-10-CM

## 2023-10-24 DIAGNOSIS — M960 Pseudarthrosis after fusion or arthrodesis: Secondary | ICD-10-CM | POA: Diagnosis not present

## 2023-10-24 NOTE — Progress Notes (Unsigned)
 Subjective:  Patient ID: Nicholas Patterson, male    DOB: 01/30/1971,  MRN: [id number]  Chief Complaint  Patient presents with   Foot Pain    Patient is here for 1 month F/U- from right bunion surgery states still hurts from time to time    DOS: 11/21/2022 Procedure: Lapidus bunionectomy, akin osteotomy Right foot  53 y.o. male returns for post-op check.  Patient returns for postop check approximately 8.5 mo status post right foot Lapidus bunionectomy with Akin osteotomy.  Has been weightbearing in regular shoe.   Have been following him for nonunion at the first TMT J.  He reports he is still having some aching pain especially after being at work and on his feet all day.  He says it is not so much the pain is a soreness but does not bother him and affects how he walks.  He thinks it may be contributing to some secondary ankle pain as a result of the way he is walking.  Review of Systems: Negative except as noted in the HPI. Denies N/V/F/Ch.   Objective:  There were no vitals filed for this visit. Body mass index is 43.81 kg/m. Constitutional Well developed. Well nourished.  Vascular Foot warm and well perfused. Capillary refill normal to all digits.  Calf is soft and supple, no posterior calf or knee pain, negative Homans' sign  Neurologic Normal speech. Oriented to person, place, and time. Epicritic sensation to light touch grossly present bilaterally.  Dermatologic Skin incisions well-healed  Orthopedic: Minimal   tenderness to palpation noted about the surgical site.   Multiple view plain film radiographs: 08/08/2023 XR 3 views AP lateral oblique of the right foot.  Findings: Weightbearing attention directed to the first tarsometatarsal joint there is evidence of some osseous bridging across the first TMT J slightly improved from prior radiographs with increased radio opacity noted about the fusion site though still with a significant portion of the lateral aspect of the joint that  does appear to have a nonunion..  There is  stable increase in the first intermetatarsal angle slight recurrence of the bunion deformity with valgus angulation of the hallux. Assessment:   No diagnosis found.   Plan:  Patient was evaluated and treated and all questions answered.  S/p foot surgery right foot Lapidus bunionectomy and Akin osteotomy, with nonunion of 1st TMTJ -Discussed with patient the evidence of nonunion at the arthrodesis site and options including continued monitoring versus revision surgery which would include removal of current hardware and bone grafting at the arthrodesis site possibly with concentrated  BMA injection, possible first MPJ fusion for residual hallux valgus deformity.  Discussed the risk benefits and alternatives to surgery as well as the expected recovery time would need at least 6 weeks nonweightbearing. - Patient wishes to have more time to consider his options -Recommend high-dose vitamin D  supplementation sent Rx for 50,000 IU once weekly for the next 4 to 5 weeks -Patient will have his vitamin D  level rechecked in about 3 to 4 weeks prior to her appointment to ensure that it has improved last check was vitamin D3 level: 22 -Patient will follow-up in 1 month to discuss vitamin D  recheck as well as get his thoughts on revision versus conservative care.  I am leaning towards revising him and encouraged him that if he has any limitation due to pain I believe it would be worth revision given the evidence of nonunion at the first TMT J.  Would consider also first MPJ  arthrodesis if the hallux does not reduce upon revisional at first TMT J.         Marolyn JULIANNA Honour, DPM [facility] / [facility]

## 2023-10-31 ENCOUNTER — Encounter: Payer: Self-pay | Admitting: Family Medicine

## 2023-10-31 ENCOUNTER — Ambulatory Visit (INDEPENDENT_AMBULATORY_CARE_PROVIDER_SITE_OTHER): Payer: Self-pay | Admitting: Family Medicine

## 2023-10-31 DIAGNOSIS — E785 Hyperlipidemia, unspecified: Secondary | ICD-10-CM

## 2023-10-31 DIAGNOSIS — G4733 Obstructive sleep apnea (adult) (pediatric): Secondary | ICD-10-CM

## 2023-10-31 DIAGNOSIS — R7303 Prediabetes: Secondary | ICD-10-CM

## 2023-10-31 DIAGNOSIS — E559 Vitamin D deficiency, unspecified: Secondary | ICD-10-CM

## 2023-10-31 LAB — POCT GLYCOSYLATED HEMOGLOBIN (HGB A1C): Hemoglobin A1C: 6 % — AB (ref 4.0–5.6)

## 2023-10-31 MED ORDER — METFORMIN HCL ER 750 MG PO TB24: 750.0000 mg | ORAL_TABLET | Freq: Every day | ORAL | 1 refills | Status: AC

## 2023-10-31 MED ORDER — ROSUVASTATIN CALCIUM 10 MG PO TABS: 10.0000 mg | ORAL_TABLET | Freq: Every day | ORAL | 1 refills | Status: AC

## 2023-10-31 MED ORDER — VITAMIN D (ERGOCALCIFEROL) 1.25 MG (50000 UNIT) PO CAPS: 50000.0000 [IU] | ORAL_CAPSULE | ORAL | 0 refills | Status: AC

## 2023-10-31 NOTE — Progress Notes (Signed)
 Name: Nicholas Patterson   MRN: [id number]    DOB: November 05, 1970   Date:10/31/2023       Progress Note  Subjective  Chief Complaint  Chief Complaint  Patient presents with   Medical Management of Chronic Issues   Discussed the use of AI scribe software for clinical note transcription with the patient, who gave verbal consent to proceed.  History of Present Illness Nicholas Patterson is a 53 year old male with prediabetes who presents for a follow-up visit.  His A1c has improved from 6.4% to 6.0% over the past three months. He is unsure of the specific changes that led to the improvement in his A1c. Portion control was discussed as a strategy, and he was previously on metformin , which he ran out of a week ago.  He has a history of obstructive sleep apnea and uses a CPAP machine. He uses the CPAP for at least four hours per night, which is the minimum required. He has a nasal mask and a full nose mask, and he finds the nasal mask more comfortable. He has not seen a specialist for his CPAP in a long time.  He has high cholesterol but is not currently on medication for it. His last lab work in May showed an LDL cholesterol level of 151 mg/dL, which has been as high as 164 mg/dL in the past. He has a family history of heart disease, with his mother having had a stroke at age 69 and an uncle who had a heart attack in his late 87s.  He has a history of low vitamin D  levels, which were initially at 22 ng/mL and increased to 27 ng/mL after taking 50,000 IU of vitamin D  for a month. He was taking vitamin D  due to a non-healing bunion surgery. The bone has not fully healed, and he experiences pain in the area. He has not had an MRI to assess for potential infection.      The 10-year ASCVD risk score (Arnett DK, et al., 2019) is: 5.9%   Values used to calculate the score:     Age: 37 years     Clincally relevant sex: Male     Is Non-Hispanic African American: Yes     Diabetic: No     Tobacco smoker:  No     Systolic Blood Pressure: 130 mmHg     Is BP treated: No     HDL Cholesterol: 52 mg/dL     Total Cholesterol: 228 mg/dL  Patient Active Problem List   Diagnosis Date Noted   Morbid obesity (HCC) 07/05/2023   Pre-diabetes 03/28/2022   History of DVT in adulthood 03/27/2022   Arthritis of left shoulder region 10/06/2021   OSA (obstructive sleep apnea) 05/05/2018   DDD (degenerative disc disease), cervical 01/20/2018    Past Surgical History:  Procedure Laterality Date   COLONOSCOPY     HEMORRHOID SURGERY     HERNIA REPAIR      Family History  Problem Relation Age of Onset   COPD Mother    Cancer Father        Throat   Diabetes Son    Prostate cancer Neg Hx    Kidney disease Neg Hx     Social History   Tobacco Use   Smoking status: Former    Current packs/day: 1.50    Average packs/day: 1.5 packs/day for 17.7 years (26.6 ttl pk-yrs)    Types: Cigarettes    Start date: 2008  Passive exposure: Past   Smokeless tobacco: Never  Substance Use Topics   Alcohol use: Not Currently    Comment: occasionally     Current Outpatient Medications:    albuterol (VENTOLIN HFA) 108 (90 Base) MCG/ACT inhaler, Inhale 2 puffs into the lungs every 6 (six) hours as needed for wheezing (PRN)., Disp: , Rfl:    Cholecalciferol (VITAMIN D3) 50 MCG (2000 UT) capsule, Take 1 capsule (2,000 Units total) by mouth daily., Disp: 2 capsule, Rfl: 0   ibuprofen  (ADVIL ) 800 MG tablet, Take 1 tablet (800 mg total) by mouth every 8 (eight) hours as needed., Disp: 30 tablet, Rfl: 1   metFORMIN  (GLUCOPHAGE -XR) 500 MG 24 hr tablet, Take 1 tablet (500 mg total) by mouth daily with breakfast., Disp: 90 tablet, Rfl: 0   Vitamin D , Ergocalciferol , (DRISDOL ) 1.25 MG (50000 UNIT) CAPS capsule, TAKE 1 CAPSULE (50,000 UNITS TOTAL) BY MOUTH EVERY 7 (SEVEN) DAYS, Disp: 5 capsule, Rfl: 0  Allergies  Allergen Reactions   Codeine Nausea And Vomiting   Oxycodone  Nausea And Vomiting    I personally reviewed  active problem list, medication list, allergies, family history with the patient/caregiver today.   ROS  Ten systems reviewed and is negative except as mentioned in HPI    Objective Physical Exam CONSTITUTIONAL: Patient appears well-developed and well-nourished.  No distress. HEENT: Head atraumatic, normocephalic, neck supple. CARDIOVASCULAR: Normal rate, regular rhythm and normal heart sounds.  No murmur heard. No BLE edema. PULMONARY: Effort normal and breath sounds normal. No respiratory distress. ABDOMINAL: There is no tenderness or distention. MUSCULOSKELETAL: Normal gait. Without gross motor or sensory deficit. PSYCHIATRIC: Patient has a normal mood and affect. behavior is normal. Judgment and thought content normal.  Vitals:   10/31/23 1044  BP: 130/84  Pulse: 85  Resp: 16  SpO2: 96%  Weight: (!) 322 lb 3.2 oz (146.1 kg)  Height: 6' (1.829 m)    Body mass index is 43.7 kg/m.  Recent Results (from the past 2160 hours)  VITAMIN D  25 Hydroxy (Vit-D Deficiency, Fractures)     Status: Abnormal   Collection Time: 10/17/23  1:14 PM  Result Value Ref Range   Vit D, 25-Hydroxy 27.0 (L) 30.0 - 100.0 ng/mL    Comment: Vitamin D  deficiency has been defined by the Institute of Medicine and an Endocrine Society practice guideline as a level of serum 25-OH vitamin D  less than 20 ng/mL (1,2). The Endocrine Society went on to further define vitamin D  insufficiency as a level between 21 and 29 ng/mL (2). 1. IOM (Institute of Medicine). 2010. Dietary reference    intakes for calcium  and D. [place]: The    [employer]. 2. Holick MF, Binkley [place], Bischoff-Ferrari HA, et al.    Evaluation, treatment, and prevention of vitamin D     deficiency: an Endocrine Society clinical practice    guideline. JCEM. 2011 Jul; 96(7):1911-30.   POCT glycosylated hemoglobin (Hb A1C)     Status: Abnormal   Collection Time: 10/31/23 10:48 AM  Result Value Ref Range   Hemoglobin A1C  6.0 (A) 4.0 - 5.6 %   HbA1c POC (<> result, manual entry)     HbA1c, POC (prediabetic range)     HbA1c, POC (controlled diabetic range)        PHQ2/9:    10/31/2023   10:37 AM 07/18/2023   11:00 AM 07/05/2023    9:12 AM 03/27/2022    9:26 AM 10/06/2021    1:59 PM  Depression screen PHQ  2/9  Decreased Interest 0 0 0 0 0  Down, Depressed, Hopeless 0 0 0 0 0  PHQ - 2 Score 0 0 0 0 0  Altered sleeping  0 0 0   Tired, decreased energy  0 0 0   Change in appetite  0 0 0   Feeling bad or failure about yourself   0 0 0   Trouble concentrating  0 0 0   Moving slowly or fidgety/restless  0 0 0   Suicidal thoughts  0 0 0   PHQ-9 Score  0 0 0   Difficult doing work/chores  Not difficult at all Not difficult at all      phq 9 is negative  Fall Risk:    10/31/2023   10:37 AM 07/18/2023   10:59 AM 07/05/2023    9:05 AM 03/27/2022    9:26 AM 10/06/2021    1:59 PM  Fall Risk   Falls in the past year? 0 0 0 0 0  Number falls in past yr: 0 0 0 0 0  Injury with Fall? 0 0 0 0 0  Risk for fall due to : No Fall Risks No Fall Risks No Fall Risks No Fall Risks No Fall Risks  Follow up Falls evaluation completed Falls prevention discussed;Education provided;Falls evaluation completed Falls prevention discussed;Education provided;Falls evaluation completed Falls prevention discussed Falls prevention discussed      Data saved with a previous flowsheet row definition     Assessment & Plan Prediabetes A1c improved to 6.0%, indicating better glycemic control. Lifestyle modifications emphasized. Metformin  ran out a week ago. Insurance does not cover weight loss medication. - Prescribe metformin  750 mg twice daily. - Encourage continuation of portion control and physical activity. - Advise to check with insurance in January regarding coverage for weight loss medication.  Hyperlipidemia LDL at 151 mg/dL. Family history of cardiovascular events. Current risk of heart attack and stroke is 5.9%. -  Prescribe rosuvastatin  10 mg daily.  Obstructive sleep apnea Uses CPAP with nasal mask. Previous sleep study recommended BiPAP settings and weight loss. - Continue CPAP use with nasal mask. - Ensure documentation of CPAP use for insurance purposes. - Encourage weight loss as part of management.  Delayed bone healing after bunion surgery Persistent pain and incomplete bone healing one year post-surgery. Vitamin D  deficiency considered but improved. - Prescribe vitamin D  50,000 IU weekly for three months. - Consider MRI if symptoms persist to rule out infection or other causes.  Vitamin D  deficiency Vitamin D  level increased to 27 ng/mL after supplementation. Level still below optimal. - Prescribe vitamin D  50,000 IU weekly for three months. - Recheck vitamin D  levels in six months.

## 2023-11-12 DIAGNOSIS — G4733 Obstructive sleep apnea (adult) (pediatric): Secondary | ICD-10-CM | POA: Diagnosis not present

## 2024-01-12 DIAGNOSIS — G4733 Obstructive sleep apnea (adult) (pediatric): Secondary | ICD-10-CM | POA: Diagnosis not present

## 2024-01-30 ENCOUNTER — Ambulatory Visit: Admitting: Family Medicine

## 2024-02-12 DIAGNOSIS — G4733 Obstructive sleep apnea (adult) (pediatric): Secondary | ICD-10-CM | POA: Diagnosis not present

## 2024-03-02 ENCOUNTER — Emergency Department
Admission: EM | Admit: 2024-03-02 | Discharge: 2024-03-03 | Disposition: A | Payer: Self-pay | Attending: Emergency Medicine | Admitting: Emergency Medicine

## 2024-03-02 ENCOUNTER — Other Ambulatory Visit: Payer: Self-pay

## 2024-03-02 DIAGNOSIS — W448XXA Other foreign body entering into or through a natural orifice, initial encounter: Secondary | ICD-10-CM | POA: Insufficient documentation

## 2024-03-02 DIAGNOSIS — K529 Noninfective gastroenteritis and colitis, unspecified: Secondary | ICD-10-CM | POA: Insufficient documentation

## 2024-03-02 DIAGNOSIS — T189XXA Foreign body of alimentary tract, part unspecified, initial encounter: Secondary | ICD-10-CM

## 2024-03-02 DIAGNOSIS — K4091 Unilateral inguinal hernia, without obstruction or gangrene, recurrent: Secondary | ICD-10-CM | POA: Insufficient documentation

## 2024-03-02 DIAGNOSIS — T188XXA Foreign body in other parts of alimentary tract, initial encounter: Secondary | ICD-10-CM | POA: Insufficient documentation

## 2024-03-02 LAB — CBC WITH DIFFERENTIAL/PLATELET
Abs Immature Granulocytes: 0.02 K/uL (ref 0.00–0.07)
Basophils Absolute: 0 K/uL (ref 0.0–0.1)
Basophils Relative: 1 %
Eosinophils Absolute: 0.2 K/uL (ref 0.0–0.5)
Eosinophils Relative: 3 %
HCT: 45.6 % (ref 39.0–52.0)
Hemoglobin: 15.3 g/dL (ref 13.0–17.0)
Immature Granulocytes: 0 %
Lymphocytes Relative: 21 %
Lymphs Abs: 1.3 K/uL (ref 0.7–4.0)
MCH: 29 pg (ref 26.0–34.0)
MCHC: 33.6 g/dL (ref 30.0–36.0)
MCV: 86.4 fL (ref 80.0–100.0)
Monocytes Absolute: 0.5 K/uL (ref 0.1–1.0)
Monocytes Relative: 8 %
Neutro Abs: 4.2 K/uL (ref 1.7–7.7)
Neutrophils Relative %: 67 %
Platelets: 258 K/uL (ref 150–400)
RBC: 5.28 MIL/uL (ref 4.22–5.81)
RDW: 12.8 % (ref 11.5–15.5)
WBC: 6.1 K/uL (ref 4.0–10.5)
nRBC: 0 % (ref 0.0–0.2)

## 2024-03-02 NOTE — ED Triage Notes (Signed)
 Pt BIB  [facility] from home. Pt presents with low left abd pain, hx of hernia since last year without any follow up. Pt reports pain worse over the last 45-60 minutes. No meds PTA.

## 2024-03-02 NOTE — ED Provider Notes (Signed)
 [facility] Provider Note    Event Date/Time   First MD Initiated Contact with Patient 03/02/24 2318     (approximate)   History   Hernia   HPI  Ilian E Babe is a 54 y.o. male presenting with pain in his left inguinal hernia.  States has been ongoing for the past year.  Was supposed to follow-up with a surgeon but did not.  He denies any nausea vomiting, states that he is having bowel movements, no diarrhea.  No fever.  States that the hernia goes in and out.  States that he started hurting at his left inguinal hernia now or prior to presentation.  Per independent history from EMS, he is presenting for pain at his left inguinal hernia site.  They state that patient slept the entire right here, was not having any discomfort or pain, no pain meds were given.  On independent review, he was seen at [facility] last year for intermittent groin swelling, had a CT that showed an inguinal hernia.     Physical Exam   Triage Vital Signs: ED Triage Vitals [03/02/24 2321]  Encounter Vitals Group     BP      Girls Systolic BP Percentile      Girls Diastolic BP Percentile      Boys Systolic BP Percentile      Boys Diastolic BP Percentile      Pulse      Resp      Temp      Temp src      SpO2 98 %     Weight      Height      Head Circumference      Peak Flow      Pain Score      Pain Loc      Pain Education      Exclude from Growth Chart     Most recent vital signs: Vitals:   03/02/24 2321  SpO2: 98%     General: Awake, no distress. CV:  Good peripheral perfusion.  Resp:  Normal effort.  Abd:  No distention.  Abdomen soft nontender Other:  He has a left inguinal hernia without overlying skin changes, it is reducible, no tenderness on palpation to his inguinal hernia.   ED Results / Procedures / Treatments   Labs (all labs ordered are listed, but only abnormal results are displayed) Labs Reviewed - No data to display   RADIOLOGY On my  independent interpretation, ***   PROCEDURES:  Critical Care performed: {CriticalCareYesNo:19197::Yes, see critical care procedure note(s),No}  Procedures   MEDICATIONS ORDERED IN ED: Medications - No data to display   IMPRESSION / MDM / ASSESSMENT AND PLAN / ED COURSE  I reviewed the triage vital signs and the nursing notes.                              Differential diagnosis includes, but is not limited to, left inguinal hernia, suspect could be pain from the hernia pulling since it is relatively large.  Considered but doubt incarceration or strangulation at this time.  Will get labs, CT.  Patient's presentation is most consistent with acute presentation with potential threat to life or bodily function.  Independent interpretation of labs and imaging below. ***        FINAL CLINICAL IMPRESSION(S) / ED DIAGNOSES   Final diagnoses:  None     Rx /  DC Orders   ED Discharge Orders     None        Note:  This document was prepared using Dragon voice recognition software and may include unintentional dictation errors.

## 2024-03-03 ENCOUNTER — Emergency Department: Payer: Self-pay

## 2024-03-03 LAB — ETHANOL: Alcohol, Ethyl (B): <15 mg/dL

## 2024-03-03 LAB — COMPREHENSIVE METABOLIC PANEL WITH GFR
ALT: 5 U/L (ref 0–44)
AST: <10 U/L — ABNORMAL LOW (ref 15–41)
Albumin: 3.8 g/dL (ref 3.5–5.0)
Alkaline Phosphatase: 62 U/L (ref 38–126)
Anion gap: 10 (ref 5–15)
BUN: 10 mg/dL (ref 6–20)
CO2: 25 mmol/L (ref 22–32)
Calcium: 9.1 mg/dL (ref 8.9–10.3)
Chloride: 101 mmol/L (ref 98–111)
Creatinine, Ser: 0.76 mg/dL (ref 0.61–1.24)
GFR, Estimated: >60 mL/min
Glucose, Bld: 386 mg/dL — ABNORMAL HIGH (ref 70–99)
Potassium: 3.4 mmol/L — ABNORMAL LOW (ref 3.5–5.1)
Sodium: 135 mmol/L (ref 135–145)
Total Bilirubin: 0.5 mg/dL (ref 0.0–1.2)
Total Protein: 6.6 g/dL (ref 6.5–8.1)

## 2024-03-03 MED ORDER — IOHEXOL 300 MG/ML  SOLN
100.0000 mL | Freq: Once | INTRAMUSCULAR | Status: AC | PRN
  Administered 2024-03-03: 100 mL via INTRAVENOUS

## 2024-03-03 NOTE — Discharge Instructions (Addendum)
 Please make sure to follow-up with surgery for further management of your inguinal hernia.

## 2024-03-03 NOTE — ED Notes (Signed)
 Pt ready for d/c. Pt does not have phone and no way to contact a ride. Pt provided this RN with a number for his Nephew.   This RN attempted to contact Lynwood Lincoln, at [phone] x [phone] with phone going straight to VM. Pt aware. Charge RN aware.

## 2024-07-10 ENCOUNTER — Encounter: Payer: Self-pay | Admitting: Family Medicine
